# Patient Record
Sex: Female | Born: 1950
Health system: Southern US, Community
[De-identification: ages and names within clinical notes are randomized; demographics above are authoritative.]

## PROBLEM LIST (undated history)

## (undated) DIAGNOSIS — M1990 Unspecified osteoarthritis, unspecified site: Secondary | ICD-10-CM

## (undated) DIAGNOSIS — K219 Gastro-esophageal reflux disease without esophagitis: Secondary | ICD-10-CM

## (undated) DIAGNOSIS — Q614 Renal dysplasia: Secondary | ICD-10-CM

## (undated) DIAGNOSIS — N179 Acute kidney failure, unspecified: Secondary | ICD-10-CM

## (undated) DIAGNOSIS — Z9889 Other specified postprocedural states: Secondary | ICD-10-CM

## (undated) DIAGNOSIS — F329 Major depressive disorder, single episode, unspecified: Secondary | ICD-10-CM

## (undated) DIAGNOSIS — Z973 Presence of spectacles and contact lenses: Secondary | ICD-10-CM

## (undated) DIAGNOSIS — F32A Depression, unspecified: Secondary | ICD-10-CM

## (undated) DIAGNOSIS — T8859XA Other complications of anesthesia, initial encounter: Secondary | ICD-10-CM

## (undated) DIAGNOSIS — N950 Postmenopausal bleeding: Secondary | ICD-10-CM

## (undated) DIAGNOSIS — E039 Hypothyroidism, unspecified: Secondary | ICD-10-CM

## (undated) DIAGNOSIS — N135 Crossing vessel and stricture of ureter without hydronephrosis: Secondary | ICD-10-CM

## (undated) DIAGNOSIS — J449 Chronic obstructive pulmonary disease, unspecified: Secondary | ICD-10-CM

## (undated) DIAGNOSIS — R112 Nausea with vomiting, unspecified: Secondary | ICD-10-CM

## (undated) DIAGNOSIS — R911 Solitary pulmonary nodule: Secondary | ICD-10-CM

## (undated) DIAGNOSIS — I456 Pre-excitation syndrome: Secondary | ICD-10-CM

## (undated) HISTORY — PX: CARDIAC ELECTROPHYSIOLOGY STUDY AND ABLATION: SHX1294

## (undated) HISTORY — PX: DILATION AND CURETTAGE OF UTERUS: SHX78

## (undated) HISTORY — DX: Depression, unspecified: F32.A

## (undated) HISTORY — DX: Major depressive disorder, single episode, unspecified: F32.9

## (undated) HISTORY — DX: Hypothyroidism, unspecified: E03.9

---

## 1998-08-19 HISTORY — PX: EXCISION MORTON'S NEUROMA: SHX5013

## 1998-11-27 ENCOUNTER — Other Ambulatory Visit: Admission: RE | Admit: 1998-11-27 | Discharge: 1998-11-27 | Payer: Self-pay | Admitting: Gynecology

## 1999-02-12 ENCOUNTER — Other Ambulatory Visit: Admission: RE | Admit: 1999-02-12 | Discharge: 1999-02-12 | Payer: Self-pay | Admitting: Podiatry

## 1999-12-27 ENCOUNTER — Encounter: Payer: Self-pay | Admitting: Nephrology

## 1999-12-27 ENCOUNTER — Ambulatory Visit (HOSPITAL_COMMUNITY): Admission: RE | Admit: 1999-12-27 | Discharge: 1999-12-27 | Payer: Self-pay | Admitting: Nephrology

## 2000-04-25 ENCOUNTER — Encounter: Payer: Self-pay | Admitting: Gynecology

## 2000-04-25 ENCOUNTER — Encounter: Admission: RE | Admit: 2000-04-25 | Discharge: 2000-04-25 | Payer: Self-pay | Admitting: Gynecology

## 2000-05-05 ENCOUNTER — Encounter: Admission: RE | Admit: 2000-05-05 | Discharge: 2000-05-05 | Payer: Self-pay | Admitting: Gynecology

## 2000-05-05 ENCOUNTER — Encounter: Payer: Self-pay | Admitting: Gynecology

## 2000-05-19 ENCOUNTER — Other Ambulatory Visit: Admission: RE | Admit: 2000-05-19 | Discharge: 2000-05-19 | Payer: Self-pay | Admitting: Gynecology

## 2000-11-12 ENCOUNTER — Encounter: Admission: RE | Admit: 2000-11-12 | Discharge: 2000-11-12 | Payer: Self-pay | Admitting: Gynecology

## 2000-11-12 ENCOUNTER — Encounter: Payer: Self-pay | Admitting: Gynecology

## 2002-05-28 ENCOUNTER — Other Ambulatory Visit: Admission: RE | Admit: 2002-05-28 | Discharge: 2002-05-28 | Payer: Self-pay | Admitting: Gynecology

## 2003-07-19 ENCOUNTER — Other Ambulatory Visit: Admission: RE | Admit: 2003-07-19 | Discharge: 2003-07-19 | Payer: Self-pay | Admitting: Gynecology

## 2003-07-27 ENCOUNTER — Emergency Department (HOSPITAL_COMMUNITY): Admission: AC | Admit: 2003-07-27 | Discharge: 2003-07-27 | Payer: Self-pay

## 2003-09-06 ENCOUNTER — Encounter: Admission: RE | Admit: 2003-09-06 | Discharge: 2003-09-06 | Payer: Self-pay | Admitting: Gynecology

## 2004-09-25 ENCOUNTER — Other Ambulatory Visit: Admission: RE | Admit: 2004-09-25 | Discharge: 2004-09-25 | Payer: Self-pay | Admitting: Gynecology

## 2004-10-04 ENCOUNTER — Encounter: Admission: RE | Admit: 2004-10-04 | Discharge: 2004-10-04 | Payer: Self-pay | Admitting: Gynecology

## 2005-09-26 ENCOUNTER — Other Ambulatory Visit: Admission: RE | Admit: 2005-09-26 | Discharge: 2005-09-26 | Payer: Self-pay | Admitting: Gynecology

## 2005-10-07 ENCOUNTER — Encounter: Admission: RE | Admit: 2005-10-07 | Discharge: 2005-10-07 | Payer: Self-pay | Admitting: Gynecology

## 2005-10-24 ENCOUNTER — Encounter: Admission: RE | Admit: 2005-10-24 | Discharge: 2005-10-24 | Payer: Self-pay | Admitting: Gynecology

## 2006-09-29 ENCOUNTER — Other Ambulatory Visit: Admission: RE | Admit: 2006-09-29 | Discharge: 2006-09-29 | Payer: Self-pay | Admitting: Gynecology

## 2006-11-27 ENCOUNTER — Encounter: Admission: RE | Admit: 2006-11-27 | Discharge: 2006-11-27 | Payer: Self-pay | Admitting: Gynecology

## 2006-12-02 ENCOUNTER — Encounter: Admission: RE | Admit: 2006-12-02 | Discharge: 2006-12-02 | Payer: Self-pay | Admitting: Gynecology

## 2007-05-26 ENCOUNTER — Encounter: Admission: RE | Admit: 2007-05-26 | Discharge: 2007-05-26 | Payer: Self-pay | Admitting: Gynecology

## 2007-06-24 ENCOUNTER — Ambulatory Visit: Payer: Self-pay | Admitting: Gastroenterology

## 2007-07-03 ENCOUNTER — Ambulatory Visit: Payer: Self-pay | Admitting: Gastroenterology

## 2007-07-03 ENCOUNTER — Encounter: Payer: Self-pay | Admitting: Gastroenterology

## 2007-12-08 ENCOUNTER — Encounter: Admission: RE | Admit: 2007-12-08 | Discharge: 2007-12-08 | Payer: Self-pay | Admitting: Gynecology

## 2008-01-18 ENCOUNTER — Ambulatory Visit (HOSPITAL_COMMUNITY): Admission: RE | Admit: 2008-01-18 | Discharge: 2008-01-18 | Payer: Self-pay | Admitting: Nephrology

## 2008-10-05 ENCOUNTER — Encounter: Payer: Self-pay | Admitting: Cardiology

## 2008-10-05 HISTORY — PX: TRANSTHORACIC ECHOCARDIOGRAM: SHX275

## 2008-11-04 ENCOUNTER — Ambulatory Visit: Payer: Self-pay | Admitting: Internal Medicine

## 2008-11-04 ENCOUNTER — Encounter: Payer: Self-pay | Admitting: Internal Medicine

## 2008-11-11 ENCOUNTER — Ambulatory Visit: Payer: Self-pay

## 2008-11-11 ENCOUNTER — Ambulatory Visit: Payer: Self-pay | Admitting: Internal Medicine

## 2008-11-17 ENCOUNTER — Ambulatory Visit: Payer: Self-pay | Admitting: Internal Medicine

## 2008-11-17 LAB — CONVERTED CEMR LAB
BUN: 15 mg/dL (ref 6–23)
Basophils Absolute: 0.1 10*3/uL (ref 0.0–0.1)
Basophils Relative: 1.2 % (ref 0.0–3.0)
CO2: 28 meq/L (ref 19–32)
Calcium: 9.3 mg/dL (ref 8.4–10.5)
Chloride: 103 meq/L (ref 96–112)
Creatinine, Ser: 1 mg/dL (ref 0.4–1.2)
Eosinophils Absolute: 0.3 10*3/uL (ref 0.0–0.7)
Eosinophils Relative: 2.8 % (ref 0.0–5.0)
GFR calc non Af Amer: 61.07 mL/min (ref >60)
Glucose, Bld: 111 mg/dL — ABNORMAL HIGH (ref 70–99)
HCT: 42.8 % (ref 36.0–46.0)
Hemoglobin: 14.9 g/dL (ref 12.0–15.0)
INR: 1 (ref 0.8–1.0)
Lymphocytes Relative: 31.9 % (ref 12.0–46.0)
Lymphs Abs: 3.2 10*3/uL (ref 0.7–4.0)
MCHC: 34.7 g/dL (ref 30.0–36.0)
MCV: 91.1 fL (ref 78.0–100.0)
Monocytes Absolute: 0.5 10*3/uL (ref 0.1–1.0)
Monocytes Relative: 5.5 % (ref 3.0–12.0)
Neutro Abs: 5.8 10*3/uL (ref 1.4–7.7)
Neutrophils Relative %: 58.6 % (ref 43.0–77.0)
Platelets: 337 10*3/uL (ref 150.0–400.0)
Potassium: 3.7 meq/L (ref 3.5–5.1)
Prothrombin Time: 11 s (ref 10.9–13.3)
RBC: 4.7 M/uL (ref 3.87–5.11)
RDW: 13.4 % (ref 11.5–14.6)
Sodium: 138 meq/L (ref 135–145)
WBC: 9.9 10*3/uL (ref 4.5–10.5)
aPTT: 34.1 s — ABNORMAL HIGH (ref 21.7–28.8)

## 2008-11-23 ENCOUNTER — Observation Stay (HOSPITAL_COMMUNITY): Admission: AD | Admit: 2008-11-23 | Discharge: 2008-11-23 | Payer: Self-pay | Admitting: Internal Medicine

## 2008-11-23 ENCOUNTER — Ambulatory Visit: Payer: Self-pay | Admitting: Internal Medicine

## 2008-12-13 ENCOUNTER — Encounter: Admission: RE | Admit: 2008-12-13 | Discharge: 2008-12-13 | Payer: Self-pay | Admitting: Gynecology

## 2008-12-27 ENCOUNTER — Ambulatory Visit: Payer: Self-pay | Admitting: Internal Medicine

## 2008-12-27 DIAGNOSIS — I456 Pre-excitation syndrome: Secondary | ICD-10-CM | POA: Insufficient documentation

## 2008-12-27 DIAGNOSIS — F17200 Nicotine dependence, unspecified, uncomplicated: Secondary | ICD-10-CM | POA: Insufficient documentation

## 2009-01-06 ENCOUNTER — Encounter: Payer: Self-pay | Admitting: Internal Medicine

## 2009-06-23 ENCOUNTER — Telehealth (INDEPENDENT_AMBULATORY_CARE_PROVIDER_SITE_OTHER): Payer: Self-pay | Admitting: *Deleted

## 2009-06-29 ENCOUNTER — Ambulatory Visit (HOSPITAL_BASED_OUTPATIENT_CLINIC_OR_DEPARTMENT_OTHER): Admission: RE | Admit: 2009-06-29 | Discharge: 2009-06-30 | Payer: Self-pay | Admitting: Gynecology

## 2009-06-29 HISTORY — PX: OTHER SURGICAL HISTORY: SHX169

## 2009-12-28 ENCOUNTER — Encounter: Admission: RE | Admit: 2009-12-28 | Discharge: 2009-12-28 | Payer: Self-pay | Admitting: Gynecology

## 2010-02-21 ENCOUNTER — Encounter: Admission: RE | Admit: 2010-02-21 | Discharge: 2010-02-21 | Payer: Self-pay | Admitting: Nephrology

## 2010-09-09 ENCOUNTER — Encounter: Payer: Self-pay | Admitting: Gynecology

## 2010-11-21 LAB — HEMOGLOBIN AND HEMATOCRIT, BLOOD
HCT: 39.2 % (ref 36.0–46.0)
Hemoglobin: 13.5 g/dL (ref 12.0–15.0)

## 2010-11-21 LAB — POCT HEMOGLOBIN-HEMACUE: Hemoglobin: 16 g/dL — ABNORMAL HIGH (ref 12.0–15.0)

## 2011-01-01 NOTE — Discharge Summary (Signed)
Jennifer George, Jennifer George NO.:  [id number]   MEDICAL RECORD NO.:  [id number]          PATIENT TYPE:  INP   LOCATION:  4705                         FACILITY:  [facility]   PHYSICIAN:  Duke Salvia, MD, FACCDATE OF BIRTH:  1950-09-25   DATE OF ADMISSION:  11/23/2008  DATE OF DISCHARGE:  11/23/2008                               DISCHARGE SUMMARY   This patient has no known drug allergies.   Time for this dictation and explanation of the patient is greater than  35 minutes.   FINAL DIAGNOSES:  1. Discharging day 1, status post electrophysiology study with      radiofrequency catheter ablation of a left posteroseptal accessory      pathway.  This is Wolff-Parkinson-White.  No recurrence of tachy      arrhythmia after ablation.  2. History of tachy palpitation/exercise inducible.      a.     These are self terminating, symptoms are anxiety and       paresthesias.   SECONDARY DIAGNOSES:  1. Treated hypothyroidism.  2. Ongoing tobacco habituation.   PROCEDURE:  November 23, 2008, electrophysiology study with radiofrequency  catheter ablation of a left posteroseptal concealed accessory pathway.  This is the manifest area of pre-excitation, Dr. Sherryl Manges.   BRIEF HISTORY:  Jennifer George is a 60 year old female.  She has a history  dating back 8-10 years of recurrent tachy arrhythmias.  They are  associated with abrupt onset.  They are also associated usually with  exertion.   These arrhythmias can last for an hour or 2.  They are felt by the  patient as an irregular heartbeat.  She does not have lightheadedness,  shortness of breath, or chest pain with the episodes.  Her symptoms are  paresthesias and significant anxiety.  They are frog negative and  diuretic negative.   The patient has worn an event recorder, which showed 2 tachycardias, 1  is an SVT at a rate of 160 beats a minute.  There is another tachycardia  at about 250 beats a minute.  The second faster  tachycardia is  unassociated with any significant symptoms for the patient.   This is an exercise-induced supraventricular tachycardia, probably  mediated by an accessory pathway though it may be an atrial tachycardia.  The patient had an exercise study, which showed induction of a narrow  QRS tachycardia during the exercise.  This was unassociated with PR  prolongation.  During the recovery phase, the patient did have premature  ventricular tachycardias, which she was able to feel as a thumping  feeling.   In light of the monitor studies in light of her symptoms and in light of  her exercise test, therapeutic options have been discussed with the  patient.  They include the use of beta-blockers and calcium channel  blockers, antiarrhythmic drugs as well as catheter ablation.  The risks  and benefits of catheter ablation in particular were discussed with the  patient and she wishes to exercise this modality.  We will try to  schedule this at the first convenient opportunity.   HOSPITAL COURSE:  The  patient presents electively on November 23, 2008, she  underwent electrophysiology study the same day.  A left posteroseptal  concealed accessory pathway was identified consistent with a Jennifer George-  Parkinson-White syndrome.  This was ablated at the manifest pre-  excitation site and there was no inducible SVT after ablation therapy.   The patient will be discharged on enteric-coated aspirin for the next 6  weeks at least.  She will continue her other home medications, which  will include:  1. Synthroid 88 mcg daily.  2. Trazodone 50 mg 2 tablets at bedtime.  3. She also has hormone replacement therapy as she has been practicing      prior to this admission.  4. Flonase as needed.  5. Toprol 50 mg tablets as needed for palpitation.  6. She will be given a prescription for amoxicillin in case she has      dental work in the next 6 months even if it is only teeth cleaning.      She is to take a  250 mg tablet 1 every 8 hours on the day of      dentistry and 1 in the morning the day after dentistry.   She follows up at [facility] [address] to see Dr.  Graciela Husbands on Tuesday, Dec 27, 2008, at 3:30.   LABORATORY STUDIES:  Pertinent to this admission were drawn on November 21, 2008.  Sodium is 138, potassium 3.7, chloride 103, carbonate 28, glucose  111, BUN is 15, and creatinine is 1.  White cells 9.9, hemoglobin 14.9,  hematocrit 42.8, and platelets are 337.  Protime this admission is 11,  INR is 1, and the PTT is 34.1.      Jennifer George, [place]      Duke Salvia, MD, [facility]  Electronically Signed    GM/MEDQ  D:  11/23/2008  T:  11/24/2008  Job:  [id number]   cc:   Colleen Can. Deborah Chalk, M.D.  Gretta Cool, M.D.  Geoffry Paradise, M.D.

## 2011-01-01 NOTE — Op Note (Signed)
Jennifer George, Jennifer George NO.:  [id number]   MEDICAL RECORD NO.:  [id number]          PATIENT TYPE:  INP   LOCATION:  4705                         FACILITY:  [facility]   PHYSICIAN:  Duke Salvia, MD, FACCDATE OF BIRTH:  12-21-1950   DATE OF PROCEDURE:  11/23/2008  DATE OF DISCHARGE:  11/23/2008                               OPERATIVE REPORT   PREOPERATIVE DIAGNOSIS:  Supraventricular tachycardia.   POSTOPERATIVE DIAGNOSIS:  Wolff-Parkinson-White syndrome.   PROCEDURE:  Invasive electrophysiological study, arrhythmia mapping, and  radiofrequency catheter ablation.   Following obtaining informed consent, the patient was brought to the  Electrophysiology Laboratory and placed on the fluoroscopic table in the  supine position.  After routine prep and drape, cardiac catheterization  was performed with local anesthesia and conscious sedation.  Noninvasive  blood pressure monitoring, transcutaneous oxygen saturation monitoring,  and end-tidal CO2 monitoring were performed continuously throughout the  procedure.  Following the procedure, the catheters were removed,  hemostasis was obtained, and the patient was transferred to the floor  stable condition.   Catheters of 5-French quadripolar catheter was inserted via the femoral  vein to the AV junction.   A 5-French quadripolar catheter was inserted via the left femoral vein  to the right ventricular apex.   A 6-French octapolar catheter was inserted via the right femoral vein to  the coronary sinus.   A 7-French 5-mm deflectable tip ablation catheter was inserted via the  right femoral artery to mapping sites in the posterior mitral annular  region.   Surface leads I, aVF, and V1 were monitored continuously throughout the  procedure.  Following insertion of the catheters, a stimulation protocol  included:  1. Incremental atrial pacing.  2. Incremental ventricular pacing.  3. Single atrial extrastimuli paced cycle  length was 604 milliseconds.   END-TIDAL RESULTS:  End-tidal surface electrocardiogram.  Initial:  Rhythm:  Sinus; RR interval 1145 milliseconds; PR interval 169  milliseconds; QRS duration 69 milliseconds; QT interval 394  milliseconds; P-wave duration 89 milliseconds; preexcitation:  Intermittent; bundle-branch block:  Absent.   AH interval was 191 milliseconds; HV interval was either 0 milliseconds  during preexcitation beat or 45 milliseconds and none preexcitation  beat.   Final:  Rhythm:  Sinus; RR interval 940 milliseconds; PR interval 171  milliseconds; QRS duration 120 milliseconds; QT interval 409  milliseconds; P-wave duration 101 milliseconds; AH interval 111  milliseconds; HV interval 44 milliseconds; preexcitation:  Absent.   AV nodal function:  AV Wenckebach was 4 milliseconds.  VA conduction was disassociated.  AV nodal conduction was continuous.   Accessory pathway function:  A left posterior accessory pathway was  identified with an ERP at 400 milliseconds of 300 milliseconds.  It was  intermittently evident.  Upon arriving to the lab, the patient became  increasingly sedated, it was more evident.  She had one-to-one  conduction over the accessory pathway while asleep at paced rate of up  to 400 milliseconds.  Thereafter, we had block.   Attempts to induce arrhythmia were fraught with peril as we ended up  with atrial fibrillation.  Because of that, we  ended up using verapamil  intravenously to augment VA block and then undertook mapping during  ventricular pacing.   I struggled with a fair amount getting the catheter in position  posteriorly.  Once above the mitral annulus, we were able to get it at a  rather septal insertion site.  However, it recurred.  Dr. Ladona Ridgel came by  and offered assistance and he was able to get the catheter rather  expeditiously on the ventricular side of the mitral annulus somewhat  more laterally as is typical with the diagonal  alignment of these  connections and catheter ablation was thereafter successful in  eliminating bidirectional conduction over the accessory pathway.   A total of 5 minutes and 59 seconds of radiofrequency energy was applied  in multiple applications.   A total of 28 minutes and 15 seconds of fluoroscopy time was utilized at  75 frames per second.   IMPRESSION:  1. Normal sinus function.  2. Normal atrial function.  3. Normal His-Purkinje system function.  4. Normal arteriovenous nodal function.  5. A left posterior bidirectional conducting accessory pathway was      identified, more evident as the patient became increasingly sedated      and interestingly with an HV interval of -50 milliseconds at paced      cycle length of 600 milliseconds.  It had not been seen while awake      and this is consistent with very rapid antegrade AV nodal      conduction as must have been true with tachycardia 240 milliseconds      which we had seen following her treadmill.  Catheter ablation      successfully eliminated conduction bidirectionally over this      pathway limiting the patient's substrate for arrhythmia.   SUMMARY:  In conclusion, the results of electrophysiological testing  confirmed a pathway-dependent tachycardia.  We were surprised to find  she had bidirectional conduction over this pathway.  In any case,  catheter ablation successfully eliminated conduction over the pathway  and thereby the substrate for the patient's arrhythmia.   The patient will be observed overnight.  Aspirin daily for 6 weeks.  Endocarditis prophylaxis for 6 weeks.      Duke Salvia, MD, [facility]  Electronically Signed     SCK/MEDQ  D:  11/23/2008  T:  11/24/2008  Job:  [id number]   cc:   Colleen Can. Deborah Chalk, M.D.

## 2011-01-01 NOTE — Letter (Signed)
November 11, 2008    Colleen Can. Deborah Chalk, M.D.  [address]  [place], [place] [address]   RE:  Jennifer George, Jennifer George  MRN:  [id number]  /  DOB:  1950/09/03   Dear Weyman Croon,   This letter is a little bit belated about your patient, Jennifer George.  I know you have had conversations with her husband in the  interim.   As you know, she is a 60 year old woman who smokes who has a  longstanding history of 8-10 years of recurrent abrupt onset tachy  palpitations that are associated with exertion.  They are sufficiently  reproducible that she would say that they occurred every time she would  be in a tennis clinic or any time she did anything besides mild doubles  tennis.  These could last for an hour or two and were frequently  associated with a stark irregularity.  There was no lightheadedness,  shortness of breath or chest pain with these episodes.  There was some  paresthesias and significant anxiety.  They were FROG negative and  diuretic negative.   In the setting of this, you undertook an event recorder which was  strikingly abnormal in that it showed two tachycardias, one was in the  SVT at a rate of about 160 beats per minute, the other was a tachycardia  that I thought for a long time was artifactual that went at a rate of  250 beats per minute and seemed to be somewhat wide.  The time of that  second tachycardia was unassociated with any significant symptoms.   Her past medical history is notable for thyroid disease, arthritis and  discomfort in her legs, possibly claudication occurring with exercise.   She has had no prior surgery.   REVIEW OF SYSTEMS:  Otherwise negative.   MEDICATIONS:  1. Synthroid 88.  2. Trazodone 2 h.s.  3. Hormone replacement therapy.  4. Tylenol.   ALLERGIES:  NO KNOWN DRUG ALLERGIES.   SOCIAL HISTORY:  She is married.  She has three children 27, 24 and 19.  She continues to smoke three-quarters of a pack a day.  She uses some  alcohol and some caffeine.   PHYSICAL EXAMINATION:  VITAL SIGNS:  The other day, her blood pressure  was 120/90 with a pulse of 70, weight was 139.  HEENT:  Demonstrates no icterus or xanthoma.  NECK:  Her neck veins were flat.  The carotids are brisk and full  bilaterally without bruits.  BACK:  Without kyphosis or scoliosis.  LUNGS:  Clear.  HEART:  Sounds were regular without murmurs or gallops.  ABDOMEN:  Soft with active bowel sounds without midline pulsation or  hepatomegaly.  EXTREMITIES:  Femoral pulses were 2+.  Distal pulses were intact.  There  was no clubbing, cyanosis or edema.  NEUROLOGICAL:  Grossly normal.  SKIN:  Warm and dry.   Electrocardiogram dated the other day demonstrated sinus rhythm at 70  with intervals of 0.16/0.8/0.39.  There is no evidence of ventricular  pre-excitation.  There was no isolated PVC.   The exercise test that was done today demonstrated the induction of a  narrow QRS tachycardia during exercise, unassociated with PR  prolongation.  The tachycardia peak cycle length was approximately 240  milliseconds, which was about the cycle length of the tachycardia on the  event recorder that I could not discern the mechanism.   IMPRESSION:  1. Exercise-induced supraventricular tachycardia, probably mediated      via  an accessory pathway, although it may be an atrial tachycardia.  2. Post exertion and resting PVCs associated with a thumping feeling.  3. Poor exercise tolerance as she was only able to go out a minute      into stage II before exhaustion overwhelmed her but just prior to      that she ended having her SVT.  4. Ongoing cigarette use.   DISCUSSION:  Weyman Croon, Mrs. Jennifer George has recurrent exercise-induced  supraventricular tachycardia and postexercise ventricular ectopy.  We  discussed treatment options, including use of beta-blockers, calcium  blockers,  antiarrhythmic drugs with their potential for proarrhythmia  and catheter ablation.   We discussed the potential benefits, as well as  the potential risks, including, but not limited to, death, perforation  and heart block requiring pacemaker implantation.  She would like to  proceed with the latter.   We also discussed the fact that the postexercise ventricular ectopy  would be unmitigated.  She understands and appreciates the fact that if  she could get rid of the exercise-induced tachycardia that the rest of  things may well take care of themselves.   We will plan to schedule this after I get back from spring break on  November 23, 2008.   Thanks very much for this consultation.    Sincerely,      Duke Salvia, MD, [facility]  Electronically Signed    SCK/MedQ  DD: 11/11/2008  DT: 11/11/2008  Job #: [id number]   CC:    Geoffry Paradise, M.D.

## 2011-01-28 ENCOUNTER — Other Ambulatory Visit: Payer: Self-pay | Admitting: Gynecology

## 2011-01-28 DIAGNOSIS — Z1231 Encounter for screening mammogram for malignant neoplasm of breast: Secondary | ICD-10-CM

## 2011-02-01 ENCOUNTER — Ambulatory Visit: Payer: Self-pay

## 2011-02-05 ENCOUNTER — Ambulatory Visit
Admission: RE | Admit: 2011-02-05 | Discharge: 2011-02-05 | Disposition: A | Discharge status: Home / Self Care | Payer: BC Managed Care – PPO | Source: Ambulatory Visit | Attending: Gynecology | Admitting: Gynecology

## 2011-02-05 DIAGNOSIS — Z1231 Encounter for screening mammogram for malignant neoplasm of breast: Secondary | ICD-10-CM

## 2011-02-07 ENCOUNTER — Other Ambulatory Visit: Payer: Self-pay | Admitting: Gynecology

## 2011-04-16 ENCOUNTER — Other Ambulatory Visit: Payer: Self-pay | Admitting: Nephrology

## 2011-04-16 DIAGNOSIS — IMO0002 Reserved for concepts with insufficient information to code with codable children: Secondary | ICD-10-CM

## 2011-04-26 ENCOUNTER — Ambulatory Visit
Admission: RE | Admit: 2011-04-26 | Discharge: 2011-04-26 | Disposition: A | Discharge status: Home / Self Care | Payer: BC Managed Care – PPO | Source: Ambulatory Visit | Attending: Nephrology | Admitting: Nephrology

## 2011-04-26 DIAGNOSIS — IMO0002 Reserved for concepts with insufficient information to code with codable children: Secondary | ICD-10-CM

## 2011-05-07 ENCOUNTER — Other Ambulatory Visit: Payer: Self-pay | Admitting: Nephrology

## 2011-05-07 DIAGNOSIS — N281 Cyst of kidney, acquired: Secondary | ICD-10-CM

## 2011-05-08 ENCOUNTER — Ambulatory Visit
Admission: RE | Admit: 2011-05-08 | Discharge: 2011-05-08 | Disposition: A | Discharge status: Home / Self Care | Payer: BC Managed Care – PPO | Source: Ambulatory Visit | Attending: Nephrology | Admitting: Nephrology

## 2011-05-08 DIAGNOSIS — N281 Cyst of kidney, acquired: Secondary | ICD-10-CM

## 2011-05-09 ENCOUNTER — Other Ambulatory Visit (HOSPITAL_COMMUNITY): Payer: Self-pay | Admitting: Nephrology

## 2011-05-09 DIAGNOSIS — N281 Cyst of kidney, acquired: Secondary | ICD-10-CM

## 2011-05-16 LAB — CBC
HCT: 46.2 — ABNORMAL HIGH
Hemoglobin: 15.7 — ABNORMAL HIGH
MCHC: 34
MCV: 92.1
Platelets: 353
RBC: 5.01
RDW: 14.3
WBC: 11.3 — ABNORMAL HIGH

## 2011-05-16 LAB — PROTIME-INR
INR: 0.9
Prothrombin Time: 12.6

## 2011-05-16 LAB — APTT: aPTT: 34

## 2011-05-29 ENCOUNTER — Ambulatory Visit (HOSPITAL_COMMUNITY)
Admission: RE | Admit: 2011-05-29 | Discharge: 2011-05-29 | Disposition: A | Discharge status: Home / Self Care | Payer: BC Managed Care – PPO | Source: Ambulatory Visit | Attending: Nephrology | Admitting: Nephrology

## 2011-05-29 ENCOUNTER — Other Ambulatory Visit (HOSPITAL_COMMUNITY): Payer: Self-pay | Admitting: Nephrology

## 2011-05-29 DIAGNOSIS — I456 Pre-excitation syndrome: Secondary | ICD-10-CM | POA: Insufficient documentation

## 2011-05-29 DIAGNOSIS — E039 Hypothyroidism, unspecified: Secondary | ICD-10-CM | POA: Insufficient documentation

## 2011-05-29 DIAGNOSIS — Q619 Cystic kidney disease, unspecified: Secondary | ICD-10-CM | POA: Insufficient documentation

## 2011-05-29 DIAGNOSIS — N281 Cyst of kidney, acquired: Secondary | ICD-10-CM

## 2011-05-29 DIAGNOSIS — Z01812 Encounter for preprocedural laboratory examination: Secondary | ICD-10-CM | POA: Insufficient documentation

## 2011-05-29 DIAGNOSIS — F17200 Nicotine dependence, unspecified, uncomplicated: Secondary | ICD-10-CM | POA: Insufficient documentation

## 2011-05-29 LAB — CBC
HCT: 45.6 % (ref 36.0–46.0)
Hemoglobin: 15.8 g/dL — ABNORMAL HIGH (ref 12.0–15.0)
MCH: 30.6 pg (ref 26.0–34.0)
MCHC: 34.6 g/dL (ref 30.0–36.0)
MCV: 88.4 fL (ref 78.0–100.0)
Platelets: 399 10*3/uL (ref 150–400)
RBC: 5.16 MIL/uL — ABNORMAL HIGH (ref 3.87–5.11)
RDW: 14.2 % (ref 11.5–15.5)
WBC: 9.5 10*3/uL (ref 4.0–10.5)

## 2011-05-29 LAB — PROTIME-INR
INR: 0.96 (ref 0.00–1.49)
Prothrombin Time: 13 seconds (ref 11.6–15.2)

## 2011-05-29 LAB — APTT: aPTT: 30 seconds (ref 24–37)

## 2011-12-02 ENCOUNTER — Encounter: Payer: Self-pay | Admitting: *Deleted

## 2012-02-05 ENCOUNTER — Other Ambulatory Visit: Payer: Self-pay | Admitting: Gynecology

## 2012-02-05 DIAGNOSIS — Z1231 Encounter for screening mammogram for malignant neoplasm of breast: Secondary | ICD-10-CM

## 2012-02-17 ENCOUNTER — Other Ambulatory Visit: Payer: Self-pay | Admitting: Gynecology

## 2012-02-19 ENCOUNTER — Ambulatory Visit: Payer: BC Managed Care – PPO

## 2012-06-26 ENCOUNTER — Encounter: Payer: Self-pay | Admitting: Internal Medicine

## 2012-07-14 ENCOUNTER — Ambulatory Visit
Admission: RE | Admit: 2012-07-14 | Discharge: 2012-07-14 | Disposition: A | Discharge status: Home / Self Care | Payer: BC Managed Care – PPO | Source: Ambulatory Visit | Attending: Gynecology | Admitting: Gynecology

## 2012-07-14 DIAGNOSIS — Z1231 Encounter for screening mammogram for malignant neoplasm of breast: Secondary | ICD-10-CM

## 2012-07-17 ENCOUNTER — Other Ambulatory Visit: Payer: Self-pay | Admitting: Gynecology

## 2012-07-17 DIAGNOSIS — R928 Other abnormal and inconclusive findings on diagnostic imaging of breast: Secondary | ICD-10-CM

## 2012-07-27 ENCOUNTER — Ambulatory Visit
Admission: RE | Admit: 2012-07-27 | Discharge: 2012-07-27 | Disposition: A | Discharge status: Home / Self Care | Payer: BC Managed Care – PPO | Source: Ambulatory Visit | Attending: Gynecology | Admitting: Gynecology

## 2012-07-27 DIAGNOSIS — R928 Other abnormal and inconclusive findings on diagnostic imaging of breast: Secondary | ICD-10-CM

## 2013-09-07 ENCOUNTER — Other Ambulatory Visit: Payer: Self-pay

## 2013-09-07 DIAGNOSIS — Z1231 Encounter for screening mammogram for malignant neoplasm of breast: Secondary | ICD-10-CM

## 2013-09-29 ENCOUNTER — Ambulatory Visit
Admission: RE | Admit: 2013-09-29 | Discharge: 2013-09-29 | Disposition: A | Discharge status: Home / Self Care | Payer: BC Managed Care – PPO | Source: Ambulatory Visit

## 2013-09-29 DIAGNOSIS — Z1231 Encounter for screening mammogram for malignant neoplasm of breast: Secondary | ICD-10-CM

## 2014-11-01 ENCOUNTER — Other Ambulatory Visit: Payer: Self-pay | Admitting: Internal Medicine

## 2014-11-01 DIAGNOSIS — Z139 Encounter for screening, unspecified: Secondary | ICD-10-CM

## 2014-11-07 ENCOUNTER — Other Ambulatory Visit: Payer: Self-pay

## 2014-11-07 DIAGNOSIS — Z1231 Encounter for screening mammogram for malignant neoplasm of breast: Secondary | ICD-10-CM

## 2014-11-17 ENCOUNTER — Ambulatory Visit
Admission: RE | Admit: 2014-11-17 | Discharge: 2014-11-17 | Disposition: A | Discharge status: Home / Self Care | Payer: No Typology Code available for payment source | Source: Ambulatory Visit | Attending: Internal Medicine | Admitting: Internal Medicine

## 2014-11-17 DIAGNOSIS — Z139 Encounter for screening, unspecified: Secondary | ICD-10-CM

## 2014-11-18 ENCOUNTER — Ambulatory Visit: Payer: Self-pay

## 2015-01-26 ENCOUNTER — Ambulatory Visit
Admission: RE | Admit: 2015-01-26 | Discharge: 2015-01-26 | Disposition: A | Discharge status: Home / Self Care | Payer: BLUE CROSS/BLUE SHIELD | Source: Ambulatory Visit

## 2015-01-26 DIAGNOSIS — Z1231 Encounter for screening mammogram for malignant neoplasm of breast: Secondary | ICD-10-CM

## 2015-07-19 ENCOUNTER — Other Ambulatory Visit: Payer: Self-pay | Admitting: Internal Medicine

## 2015-07-19 ENCOUNTER — Ambulatory Visit
Admission: RE | Admit: 2015-07-19 | Discharge: 2015-07-19 | Disposition: A | Discharge status: Home / Self Care | Payer: BLUE CROSS/BLUE SHIELD | Source: Ambulatory Visit | Attending: Internal Medicine | Admitting: Internal Medicine

## 2015-07-19 DIAGNOSIS — R1031 Right lower quadrant pain: Secondary | ICD-10-CM

## 2015-07-20 ENCOUNTER — Observation Stay (HOSPITAL_COMMUNITY)
Admission: EM | Admit: 2015-07-20 | Discharge: 2015-07-21 | Disposition: A | Discharge status: Home / Self Care | Payer: BLUE CROSS/BLUE SHIELD | Source: Home / Self Care | Attending: Internal Medicine | Admitting: Internal Medicine

## 2015-07-20 ENCOUNTER — Inpatient Hospital Stay: Admission: RE | Admit: 2015-07-20 | Payer: BLUE CROSS/BLUE SHIELD | Source: Ambulatory Visit

## 2015-07-20 ENCOUNTER — Encounter (HOSPITAL_COMMUNITY): Payer: Self-pay | Admitting: Emergency Medicine

## 2015-07-20 DIAGNOSIS — E039 Hypothyroidism, unspecified: Secondary | ICD-10-CM | POA: Insufficient documentation

## 2015-07-20 DIAGNOSIS — Q6100 Congenital renal cyst, unspecified: Secondary | ICD-10-CM

## 2015-07-20 DIAGNOSIS — F17210 Nicotine dependence, cigarettes, uncomplicated: Secondary | ICD-10-CM

## 2015-07-20 DIAGNOSIS — Z7982 Long term (current) use of aspirin: Secondary | ICD-10-CM

## 2015-07-20 DIAGNOSIS — Z720 Tobacco use: Secondary | ICD-10-CM | POA: Diagnosis not present

## 2015-07-20 DIAGNOSIS — N281 Cyst of kidney, acquired: Secondary | ICD-10-CM

## 2015-07-20 DIAGNOSIS — Q619 Cystic kidney disease, unspecified: Secondary | ICD-10-CM | POA: Diagnosis not present

## 2015-07-20 DIAGNOSIS — N1330 Unspecified hydronephrosis: Secondary | ICD-10-CM | POA: Insufficient documentation

## 2015-07-20 DIAGNOSIS — N179 Acute kidney failure, unspecified: Secondary | ICD-10-CM | POA: Diagnosis present

## 2015-07-20 DIAGNOSIS — Z79899 Other long term (current) drug therapy: Secondary | ICD-10-CM | POA: Insufficient documentation

## 2015-07-20 DIAGNOSIS — R911 Solitary pulmonary nodule: Secondary | ICD-10-CM | POA: Insufficient documentation

## 2015-07-20 DIAGNOSIS — Q613 Polycystic kidney, unspecified: Secondary | ICD-10-CM

## 2015-07-20 DIAGNOSIS — Z791 Long term (current) use of non-steroidal anti-inflammatories (NSAID): Secondary | ICD-10-CM

## 2015-07-20 LAB — CBC WITH DIFFERENTIAL/PLATELET
Basophils Absolute: 0.1 10*3/uL (ref 0.0–0.1)
Basophils Relative: 1 %
Eosinophils Absolute: 0.2 10*3/uL (ref 0.0–0.7)
Eosinophils Relative: 2 %
HCT: 38.5 % (ref 36.0–46.0)
Hemoglobin: 13.3 g/dL (ref 12.0–15.0)
Lymphocytes Relative: 19 %
Lymphs Abs: 2.3 10*3/uL (ref 0.7–4.0)
MCH: 30.4 pg (ref 26.0–34.0)
MCHC: 34.5 g/dL (ref 30.0–36.0)
MCV: 87.9 fL (ref 78.0–100.0)
Monocytes Absolute: 0.8 10*3/uL (ref 0.1–1.0)
Monocytes Relative: 7 %
Neutro Abs: 8.4 10*3/uL — ABNORMAL HIGH (ref 1.7–7.7)
Neutrophils Relative %: 71 %
Platelets: 414 10*3/uL — ABNORMAL HIGH (ref 150–400)
RBC: 4.38 MIL/uL (ref 3.87–5.11)
RDW: 14.2 % (ref 11.5–15.5)
WBC: 11.7 10*3/uL — ABNORMAL HIGH (ref 4.0–10.5)

## 2015-07-20 LAB — URINALYSIS, ROUTINE W REFLEX MICROSCOPIC
Bilirubin Urine: NEGATIVE
Glucose, UA: NEGATIVE mg/dL
Hgb urine dipstick: NEGATIVE
Ketones, ur: NEGATIVE mg/dL
Leukocytes, UA: NEGATIVE
Nitrite: NEGATIVE
Protein, ur: NEGATIVE mg/dL
Specific Gravity, Urine: 1.012 (ref 1.005–1.030)
pH: 6 (ref 5.0–8.0)

## 2015-07-20 LAB — HEPATIC FUNCTION PANEL
ALT: 25 U/L (ref 14–54)
AST: 21 U/L (ref 15–41)
Albumin: 3.2 g/dL — ABNORMAL LOW (ref 3.5–5.0)
Alkaline Phosphatase: 85 U/L (ref 38–126)
Bilirubin, Direct: 0.1 mg/dL (ref 0.1–0.5)
Indirect Bilirubin: 0.4 mg/dL (ref 0.3–0.9)
Total Bilirubin: 0.5 mg/dL (ref 0.3–1.2)
Total Protein: 6.9 g/dL (ref 6.5–8.1)

## 2015-07-20 LAB — BASIC METABOLIC PANEL
Anion gap: 7 (ref 5–15)
BUN: 26 mg/dL — ABNORMAL HIGH (ref 6–20)
CO2: 25 mmol/L (ref 22–32)
Calcium: 9.6 mg/dL (ref 8.9–10.3)
Chloride: 105 mmol/L (ref 101–111)
Creatinine, Ser: 2.38 mg/dL — ABNORMAL HIGH (ref 0.44–1.00)
GFR calc Af Amer: 24 mL/min — ABNORMAL LOW (ref >60)
GFR calc non Af Amer: 21 mL/min — ABNORMAL LOW (ref >60)
Glucose, Bld: 124 mg/dL — ABNORMAL HIGH (ref 65–99)
Potassium: 4.1 mmol/L (ref 3.5–5.1)
Sodium: 137 mmol/L (ref 135–145)

## 2015-07-20 MED ORDER — SODIUM CHLORIDE 0.9 % IV SOLN
INTRAVENOUS | Status: DC
  Administered 2015-07-20 (×2): via INTRAVENOUS

## 2015-07-20 MED ORDER — NICOTINE 21 MG/24HR TD PT24
21.0000 mg | MEDICATED_PATCH | Freq: Every day | TRANSDERMAL | Status: DC
  Filled 2015-07-20: qty 1

## 2015-07-20 MED ORDER — TRAZODONE HCL 100 MG PO TABS
100.0000 mg | ORAL_TABLET | Freq: Every day | ORAL | Status: DC
  Administered 2015-07-20: 100 mg via ORAL
  Filled 2015-07-20: qty 1

## 2015-07-20 MED ORDER — TRAZODONE HCL 100 MG PO TABS
200.0000 mg | ORAL_TABLET | Freq: Every day | ORAL | Status: DC
  Filled 2015-07-20: qty 2

## 2015-07-20 MED ORDER — ONDANSETRON HCL 4 MG/2ML IJ SOLN: 4.0000 mg | Freq: Once | INTRAMUSCULAR | Status: DC

## 2015-07-20 MED ORDER — HYDROMORPHONE HCL 1 MG/ML IJ SOLN: 1.0000 mg | Freq: Once | INTRAMUSCULAR | Status: DC

## 2015-07-20 MED ORDER — TRAZODONE HCL 100 MG PO TABS
100.0000 mg | ORAL_TABLET | Freq: Once | ORAL | Status: AC
  Administered 2015-07-20: 100 mg via ORAL
  Filled 2015-07-20: qty 1

## 2015-07-20 MED ORDER — VITAMIN B-6 100 MG PO TABS
100.0000 mg | ORAL_TABLET | Freq: Every day | ORAL | Status: DC
  Filled 2015-07-20: qty 1

## 2015-07-20 MED ORDER — ACETAMINOPHEN 325 MG PO TABS: 650.0000 mg | ORAL_TABLET | Freq: Four times a day (QID) | ORAL | Status: DC | PRN

## 2015-07-20 MED ORDER — SODIUM CHLORIDE 0.9 % IV SOLN
INTRAVENOUS | Status: DC
  Administered 2015-07-20: 16:00:00 via INTRAVENOUS

## 2015-07-20 MED ORDER — LEVOTHYROXINE SODIUM 88 MCG PO TABS
88.0000 ug | ORAL_TABLET | Freq: Every day | ORAL | Status: DC
  Filled 2015-07-20 (×2): qty 1

## 2015-07-20 MED ORDER — TRAZODONE HCL 50 MG PO TABS: 5000.0000 mg | ORAL_TABLET | Freq: Every day | ORAL | Status: DC

## 2015-07-20 MED ORDER — VITAMIN D3 25 MCG (1000 UNIT) PO TABS
1000.0000 [IU] | ORAL_TABLET | Freq: Every day | ORAL | Status: DC
Start: 2015-07-21 — End: 2015-07-21
  Filled 2015-07-20: qty 1

## 2015-07-20 MED ORDER — RISAQUAD PO CAPS
1.0000 | ORAL_CAPSULE | Freq: Every day | ORAL | Status: DC
  Filled 2015-07-20: qty 1

## 2015-07-20 MED ORDER — ATORVASTATIN CALCIUM 20 MG PO TABS
20.0000 mg | ORAL_TABLET | Freq: Every day | ORAL | Status: DC
  Filled 2015-07-20: qty 1

## 2015-07-20 MED ORDER — SODIUM CHLORIDE 0.9 % IV SOLN: INTRAVENOUS | Status: DC

## 2015-07-20 NOTE — ED Notes (Signed)
Pt reports RLQ pain from polycystic kidney disease (has had multiple cysts drained). Says this is an ongoing issue for her and she would like to have as many cysts drained as possible. Had CT scan done yesterday at [facility] and was referred here by MD Dwaine Gale. No other s/s. Denies N/V/D/dysuria/urinary retention. [facility] is able to fax CT results.

## 2015-07-20 NOTE — ED Notes (Signed)
Pt can go up at 19:10

## 2015-07-20 NOTE — H&P (Signed)
History and Physical  Jennifer George [id number] DOB: [birth year] DOA: 07/20/2015  Referring physician: EDP PCP: Geoffery Lyons, MD   Chief Complaint: worsening of ab pain  HPI: Jennifer George is a 64 y.o. female   With history of cystic kidney disease, has multiple IR drain of the cyst, last in 2012 started to have right lower abdominal pain a few days ago, Ct ab/pel done by pmd showed eright hydronephrosis and peinephric stranding, enlargement of right kidney cyst may be compressing the right ureter, she was sent to the ED for further eval, initial labs showed she has ARF with cr of 2.38. UA unremarkable.  She reported decreased oral intake for the last few days , she has been on diclofenac for about a year, was on bid for 35months, then changed to daily, she denies fever, no n/v, no cough. She smokes cigarette daily. EDP consulted urology, urology recommended IR drain of the renal cyst. hospitalist called to admit the patient.  Review of Systems:  Detail per HPI, Review of systems are otherwise negative  Past Medical History  Diagnosis Date  . Morton's neuroma 2000    removed  . Hypothyroidism   . Depression    Past Surgical History  Procedure Laterality Date  . Dilation and curettage of uterus  1981 & 1982   Social History:  reports that she has been smoking.  She has never used smokeless tobacco. She reports that she drinks alcohol. Her drug history is not on file. Patient lives at home & is able to participate in activities of daily living independently   No Known Allergies  History reviewed. No pertinent family history.    Prior to Admission medications   Medication Sig Start Date End Date Taking? Authorizing Provider  aspirin 81 MG tablet Take 81 mg by mouth daily.   Yes Historical Provider, MD  atorvastatin (LIPITOR) 20 MG tablet Take 20 mg by mouth daily. 06/17/15  Yes Historical Provider, MD  bifidobacterium infantis (ALIGN) capsule Take 1 capsule by  mouth daily.   Yes Historical Provider, MD  cholecalciferol (VITAMIN D) 1000 UNITS tablet Take 1,000 Units by mouth daily.   Yes Historical Provider, MD  diclofenac (VOLTAREN) 50 MG EC tablet Take 50 mg by mouth at bedtime.   Yes Historical Provider, MD  estradiol (VIVELLE-DOT) 0.05 MG/24HR patch Place 1 patch onto the skin 2 (two) times a week. Tues and Saturday.   Yes Historical Provider, MD  ibuprofen (ADVIL,MOTRIN) 200 MG tablet Take 800 mg by mouth daily as needed for moderate pain.   Yes Historical Provider, MD  levothyroxine (SYNTHROID, LEVOTHROID) 88 MCG tablet Take 88 mcg by mouth daily.   Yes Historical Provider, MD  pyridOXINE (VITAMIN B-6) 100 MG tablet Take 100 mg by mouth daily.   Yes Historical Provider, MD  traZODone (DESYREL) 50 MG tablet Take 100 tablets by mouth at bedtime.    Yes Historical Provider, MD  progesterone (PROMETRIUM) 200 MG capsule TAKE 1 CAPSULE(S) EVERY DAY BY ORAL ROUTE FOR 14 NIGHTS EVERY OTHER MONTH 06/20/15   Historical Provider, MD    Physical Exam: BP 133/69 mmHg  Pulse 86  Temp(Src) 98.1 F (36.7 C) (Oral)  Resp 20  SpO2 98%  General:  NAD, anxious Eyes: PERRL ENT: unremarkable Neck: supple, no JVD Cardiovascular: RRR Respiratory: CTABL Abdomen: fullness right lower quadrant, tender with guarding, no rebound, soft, positive bowel sounds Skin: no rash Musculoskeletal:  No edema Psychiatric: calm/cooperative Neurologic: no focal findings  Labs on Admission:  Basic Metabolic Panel:  Recent Labs Lab 07/20/15 1607  NA 137  K 4.1  CL 105  CO2 25  GLUCOSE 124*  BUN 26*  CREATININE 2.38*  CALCIUM 9.6   Liver Function Tests: No results for input(s): AST, ALT, ALKPHOS, BILITOT, PROT, ALBUMIN in the last 168 hours. No results for input(s): LIPASE, AMYLASE in the last 168 hours. No results for input(s): AMMONIA in the last 168 hours. CBC:  Recent Labs Lab 07/20/15 1607  WBC 11.7*  NEUTROABS 8.4*  HGB 13.3  HCT 38.5    MCV 87.9  PLT 414*   Cardiac Enzymes: No results for input(s): CKTOTAL, CKMB, CKMBINDEX, TROPONINI in the last 168 hours.  BNP (last 3 results) No results for input(s): BNP in the last 8760 hours.  ProBNP (last 3 results) No results for input(s): PROBNP in the last 8760 hours.  CBG: No results for input(s): GLUCAP in the last 168 hours.  Radiological Exams on Admission: Ct Abdomen Pelvis Wo Contrast  07/20/2015  CLINICAL DATA:  Right lower quadrant pain EXAM: CT ABDOMEN AND PELVIS WITHOUT CONTRAST TECHNIQUE: Multidetector CT imaging of the abdomen and pelvis was performed following the standard protocol without IV contrast. COMPARISON:  None. FINDINGS: Innumerable cysts are seen in both kidneys. There are some rim calcifications associated with left renal cysts. The largest right renal cyst is 13 cm. Previously this was reported at 11.5 cm, therefore it has enlarged. Moderate right hydronephrosis is present. There is right-sided perinephric fluid density. No obvious ureteral calculus can be identified. There is an apparent rind of soft tissue surrounding the aorta. There is no soft tissue posterior to the aorta. This does extend to the iliac vasculature region. Appendix is within normal limits. 5 mm sub solid left lower lobe pulmonary nodule posteriorly is partially imaged on image 1. Liver, gallbladder, spleen, pancreas, adrenal glands are within normal limits. Diverticulosis throughout much of the colon. Sigmoid is decompressed Bladder, uterus, and adnexa are unremarkable. No vertebral compression deformity. IMPRESSION: Right hydronephrosis and perinephric stranding have developed. These are secondary findings of ureteral obstruction. No ureteral calculus can be seen. There is an on enlarging right renal cyst which may be compressing the right ureter. There is also a soft tissue rind surrounding the aorta which may represent retroperitoneal fibrosis or possibly lymphoma. CT abdomen and pelvis  with contrast is recommended to further characterize, and is recommended as the first next step. The soft tissue rind will be better evaluated by this. Drainage of the right renal cyst may improve drainage of the right kidney. Retrograde pyelogram can be performed to evaluate for obstructing lesion in the right ureter. 5 mm sub solid nodule in the left lower lobe. Initial follow-up by chest CT without contrast is recommended in 3 months to confirm persistence. This recommendation follows the consensus statement: Recommendations for the Management of Subsolid Pulmonary Nodules Detected at CT: A Statement from the [place] as published in Radiology 2013; 266:304-317. Electronically Signed   By: Marybelle Killings M.D.   On: 07/20/2015 08:34      Assessment/Plan Present on Admission:  . Acute kidney injury ([place])  smyptomatic renal cyst/h/o cystic kidney disease: prn pain control, hold asa, IR consulted for drain of the cyst in am. Npo after midnight.  ARF: baseline cr wnl, cr 2.38 on admission, ua unremarkable, d/c NSAIDs, start ivf, repeat bmp in am  Hypothyroidism: check tsh. Continue synthroid.  Smoker: smoking cessation education provided, patient reported not ready to quit,  agreed to nicotine patch while in the hospital.  Retroperitoneal fibrosis? /2mm solid left lower lobe lung nodule, repeat ct with contrast was recommended, will need to normalize cr first.  DVT prophylaxis: scd's  Consultants:  Interventional radiology Urology contacted by EDP  Code Status: full   Family Communication:  Patient and husband  Disposition Plan: admit to med surg  Time spent: 40mins  Yarieliz Wasser MD, PhD [facility] Pager [phone] If 7PM-7AM, please contact night-coverage at [email or web address], password [facility]

## 2015-07-20 NOTE — ED Notes (Signed)
Bed: [id number] Expected date:  Expected time:  Means of arrival:  Comments: PCP sending pt over-obstructing cyst on kidney

## 2015-07-20 NOTE — ED Provider Notes (Signed)
CSN: [id number]     Arrival date & time 07/20/15  1456 History   First MD Initiated Contact with Patient 07/20/15 1530     Chief Complaint  Patient presents with  . Cyst    kidneys  . Polycystic Kidney Disease      (Consider location/radiation/quality/duration/timing/severity/associated sxs/prior Treatment) HPI Comments: Pt here with worsening abd pain x 2 days from her known polycystic kidney dz- Pt had an outpatient abd ct that showed a larger kidney cyst--pt has had these drained before and called her pcp and told to come her She denies vomiting, fever, severe diarrhea Pain characterized as sharp and worse with movement Using motrin and tylenol without relief  The history is provided by the patient.    Past Medical History  Diagnosis Date  . Morton's neuroma 2000    removed  . Hypothyroidism   . Depression    Past Surgical History  Procedure Laterality Date  . Dilation and curettage of uterus  1981 & 1982   History reviewed. No pertinent family history. Social History  Substance Use Topics  . Smoking status: Current Every Day Smoker -- 0.80 packs/day  . Smokeless tobacco: Never Used  . Alcohol Use: Yes     Comment: socially   OB History    No data available     Review of Systems  All other systems reviewed and are negative.     Allergies  Review of patient's allergies indicates no known allergies.  Home Medications   Prior to Admission medications   Medication Sig Start Date End Date Taking? Authorizing Provider  aspirin 81 MG tablet Take 81 mg by mouth daily.   Yes Historical Provider, MD  atorvastatin (LIPITOR) 20 MG tablet Take 20 mg by mouth daily. 06/17/15  Yes Historical Provider, MD  bifidobacterium infantis (ALIGN) capsule Take 1 capsule by mouth daily.   Yes Historical Provider, MD  cholecalciferol (VITAMIN D) 1000 UNITS tablet Take 1,000 Units by mouth daily.   Yes Historical Provider, MD  diclofenac (VOLTAREN) 50 MG EC tablet Take 50 mg by  mouth at bedtime.   Yes Historical Provider, MD  estradiol (VIVELLE-DOT) 0.05 MG/24HR patch Place 1 patch onto the skin 2 (two) times a week. Tues and Saturday.   Yes Historical Provider, MD  ibuprofen (ADVIL,MOTRIN) 200 MG tablet Take 800 mg by mouth daily as needed for moderate pain.   Yes Historical Provider, MD  levothyroxine (SYNTHROID, LEVOTHROID) 88 MCG tablet Take 88 mcg by mouth daily.   Yes Historical Provider, MD  pyridOXINE (VITAMIN B-6) 100 MG tablet Take 100 mg by mouth daily.   Yes Historical Provider, MD  traZODone (DESYREL) 50 MG tablet Take 100 tablets by mouth at bedtime.    Yes Historical Provider, MD  progesterone (PROMETRIUM) 200 MG capsule TAKE 1 CAPSULE(S) EVERY DAY BY ORAL ROUTE FOR 14 NIGHTS EVERY OTHER MONTH 06/20/15   Historical Provider, MD   BP 133/69 mmHg  Pulse 86  Temp(Src) 98.1 F (36.7 C) (Oral)  Resp 20  SpO2 98% Physical Exam  Constitutional: She is oriented to person, place, and time. She appears well-developed and well-nourished.  Non-toxic appearance. No distress.  HENT:  Head: Normocephalic and atraumatic.  Eyes: Conjunctivae, EOM and lids are normal. Pupils are equal, round, and reactive to light.  Neck: Normal range of motion. Neck supple. No tracheal deviation present. No thyroid mass present.  Cardiovascular: Normal rate, regular rhythm and normal heart sounds.  Exam reveals no gallop.   No murmur heard.  Pulmonary/Chest: Effort normal and breath sounds normal. No stridor. No respiratory distress. She has no decreased breath sounds. She has no wheezes. She has no rhonchi. She has no rales.  Abdominal: Soft. Normal appearance and bowel sounds are normal. She exhibits no distension. There is no tenderness. There is no rebound and no CVA tenderness.    Musculoskeletal: Normal range of motion. She exhibits no edema or tenderness.  Neurological: She is alert and oriented to person, place, and time. She has normal strength. No cranial nerve deficit or  sensory deficit. GCS eye subscore is 4. GCS verbal subscore is 5. GCS motor subscore is 6.  Skin: Skin is warm and dry. No abrasion and no rash noted.  Psychiatric: She has a normal mood and affect. Her speech is normal and behavior is normal.  Nursing note and vitals reviewed.   ED Course  Procedures (including critical care time) Labs Review Labs Reviewed  CBC WITH DIFFERENTIAL/PLATELET  BASIC METABOLIC PANEL  URINALYSIS, ROUTINE W REFLEX MICROSCOPIC (NOT AT [facility])    Imaging Review Ct Abdomen Pelvis Wo Contrast  07/20/2015  CLINICAL DATA:  Right lower quadrant pain EXAM: CT ABDOMEN AND PELVIS WITHOUT CONTRAST TECHNIQUE: Multidetector CT imaging of the abdomen and pelvis was performed following the standard protocol without IV contrast. COMPARISON:  None. FINDINGS: Innumerable cysts are seen in both kidneys. There are some rim calcifications associated with left renal cysts. The largest right renal cyst is 13 cm. Previously this was reported at 11.5 cm, therefore it has enlarged. Moderate right hydronephrosis is present. There is right-sided perinephric fluid density. No obvious ureteral calculus can be identified. There is an apparent rind of soft tissue surrounding the aorta. There is no soft tissue posterior to the aorta. This does extend to the iliac vasculature region. Appendix is within normal limits. 5 mm sub solid left lower lobe pulmonary nodule posteriorly is partially imaged on image 1. Liver, gallbladder, spleen, pancreas, adrenal glands are within normal limits. Diverticulosis throughout much of the colon. Sigmoid is decompressed Bladder, uterus, and adnexa are unremarkable. No vertebral compression deformity. IMPRESSION: Right hydronephrosis and perinephric stranding have developed. These are secondary findings of ureteral obstruction. No ureteral calculus can be seen. There is an on enlarging right renal cyst which may be compressing the right ureter. There is also a soft tissue rind  surrounding the aorta which may represent retroperitoneal fibrosis or possibly lymphoma. CT abdomen and pelvis with contrast is recommended to further characterize, and is recommended as the first next step. The soft tissue rind will be better evaluated by this. Drainage of the right renal cyst may improve drainage of the right kidney. Retrograde pyelogram can be performed to evaluate for obstructing lesion in the right ureter. 5 mm sub solid nodule in the left lower lobe. Initial follow-up by chest CT without contrast is recommended in 3 months to confirm persistence. This recommendation follows the consensus statement: Recommendations for the Management of Subsolid Pulmonary Nodules Detected at CT: A Statement from the [employer] as published in Radiology 2013; 266:304-317. Electronically Signed   By: Marybelle Killings M.D.   On: 07/20/2015 08:34   I have personally reviewed and evaluated these images and lab results as part of my medical decision-making.   EKG Interpretation None      MDM   Final diagnoses:  None    Patient with elevated creatinine likely due to obstruction from her large kidney cyst. Discussed the case with urology on call who recommends that patient have  the cyst drained by interventional radiology. Due to patient's evidence of kidney injury. Will be admitted to the hospital to have a procedure done tomorrow    Lacretia Leigh, MD 07/20/15 1731

## 2015-07-20 NOTE — ED Notes (Signed)
Unable to collect labs MD talking to patient.

## 2015-07-20 NOTE — Progress Notes (Signed)
pcp  Is Jennifer George updated

## 2015-07-21 ENCOUNTER — Observation Stay (HOSPITAL_COMMUNITY): Payer: BLUE CROSS/BLUE SHIELD

## 2015-07-21 DIAGNOSIS — Q6100 Congenital renal cyst, unspecified: Secondary | ICD-10-CM | POA: Diagnosis not present

## 2015-07-21 DIAGNOSIS — N179 Acute kidney failure, unspecified: Secondary | ICD-10-CM | POA: Diagnosis not present

## 2015-07-21 LAB — PROTIME-INR
INR: 1.08 (ref 0.00–1.49)
Prothrombin Time: 14.2 seconds (ref 11.6–15.2)

## 2015-07-21 LAB — GRAM STAIN

## 2015-07-21 LAB — CBC
HCT: 37.9 % (ref 36.0–46.0)
Hemoglobin: 12.8 g/dL (ref 12.0–15.0)
MCH: 29.8 pg (ref 26.0–34.0)
MCHC: 33.8 g/dL (ref 30.0–36.0)
MCV: 88.3 fL (ref 78.0–100.0)
Platelets: 415 10*3/uL — ABNORMAL HIGH (ref 150–400)
RBC: 4.29 MIL/uL (ref 3.87–5.11)
RDW: 14.2 % (ref 11.5–15.5)
WBC: 11 10*3/uL — ABNORMAL HIGH (ref 4.0–10.5)

## 2015-07-21 LAB — COMPREHENSIVE METABOLIC PANEL
ALT: 28 U/L (ref 14–54)
AST: 17 U/L (ref 15–41)
Albumin: 2.9 g/dL — ABNORMAL LOW (ref 3.5–5.0)
Alkaline Phosphatase: 80 U/L (ref 38–126)
Anion gap: 6 (ref 5–15)
BUN: 32 mg/dL — ABNORMAL HIGH (ref 6–20)
CO2: 23 mmol/L (ref 22–32)
Calcium: 9.5 mg/dL (ref 8.9–10.3)
Chloride: 111 mmol/L (ref 101–111)
Creatinine, Ser: 2.27 mg/dL — ABNORMAL HIGH (ref 0.44–1.00)
GFR calc Af Amer: 25 mL/min — ABNORMAL LOW (ref >60)
GFR calc non Af Amer: 22 mL/min — ABNORMAL LOW (ref >60)
Glucose, Bld: 102 mg/dL — ABNORMAL HIGH (ref 65–99)
Potassium: 4.5 mmol/L (ref 3.5–5.1)
Sodium: 140 mmol/L (ref 135–145)
Total Bilirubin: 0.5 mg/dL (ref 0.3–1.2)
Total Protein: 6.4 g/dL — ABNORMAL LOW (ref 6.5–8.1)

## 2015-07-21 LAB — C-REACTIVE PROTEIN: CRP: 7.7 mg/dL — ABNORMAL HIGH (ref <1.0)

## 2015-07-21 LAB — TSH: TSH: 0.217 u[IU]/mL — ABNORMAL LOW (ref 0.350–4.500)

## 2015-07-21 LAB — SEDIMENTATION RATE: Sed Rate: 51 mm/hr — ABNORMAL HIGH (ref 0–22)

## 2015-07-21 MED ORDER — FENTANYL CITRATE (PF) 100 MCG/2ML IJ SOLN
INTRAMUSCULAR | Status: AC | PRN
  Administered 2015-07-21 (×2): 50 ug via INTRAVENOUS

## 2015-07-21 MED ORDER — FENTANYL CITRATE (PF) 100 MCG/2ML IJ SOLN
INTRAMUSCULAR | Status: AC
  Filled 2015-07-21: qty 2

## 2015-07-21 MED ORDER — NALOXONE HCL 0.4 MG/ML IJ SOLN
INTRAMUSCULAR | Status: AC
  Filled 2015-07-21: qty 1

## 2015-07-21 MED ORDER — MIDAZOLAM HCL 2 MG/2ML IJ SOLN
INTRAMUSCULAR | Status: AC
  Filled 2015-07-21: qty 4

## 2015-07-21 MED ORDER — FLUMAZENIL 0.5 MG/5ML IV SOLN
INTRAVENOUS | Status: AC
  Filled 2015-07-21: qty 5

## 2015-07-21 MED ORDER — MIDAZOLAM HCL 2 MG/2ML IJ SOLN
INTRAMUSCULAR | Status: AC | PRN
  Administered 2015-07-21 (×3): 1 mg via INTRAVENOUS

## 2015-07-21 NOTE — Progress Notes (Signed)
1300 bandaid to right abdomen dry and intact. Bethann Punches RN

## 2015-07-21 NOTE — Procedures (Signed)
Right renal cyst aspiration 900 cc No comp/EBL

## 2015-07-21 NOTE — Progress Notes (Signed)
1230 received from IR alert, VSS band aid to right abdomen dry and intact D  [employer]

## 2015-07-21 NOTE — Progress Notes (Signed)
Chief Complaint: Patient was seen in consultation today for symptomatic right renal cyst at the request of Dr. Florencia Reasons  Referring Physician(s): Dr. Florencia Reasons  History of Present Illness: Jennifer George is a 64 y.o. female with polycystic kidney disease. She has had prior aspiration of renal cysts. She now has large symptomatic right renal cyst. IR is requested to aspirate for therapeutic relief. PMHx, meds, imaging, labs, allergies reviewed. Has been NPO this am  Past Medical History  Diagnosis Date  . Morton's neuroma 2000    removed  . Hypothyroidism   . Depression     Past Surgical History  Procedure Laterality Date  . Dilation and curettage of uterus  1981 & 1982    Allergies: Review of patient's allergies indicates no known allergies.  Medications: Prior to Admission medications   Medication Sig Start Date End Date Taking? Authorizing Provider  aspirin 81 MG tablet Take 81 mg by mouth daily.   Yes Historical Provider, MD  atorvastatin (LIPITOR) 20 MG tablet Take 20 mg by mouth daily. 06/17/15  Yes Historical Provider, MD  bifidobacterium infantis (ALIGN) capsule Take 1 capsule by mouth daily.   Yes Historical Provider, MD  cholecalciferol (VITAMIN D) 1000 UNITS tablet Take 1,000 Units by mouth daily.   Yes Historical Provider, MD  diclofenac (VOLTAREN) 50 MG EC tablet Take 50 mg by mouth at bedtime.   Yes Historical Provider, MD  estradiol (VIVELLE-DOT) 0.05 MG/24HR patch Place 1 patch onto the skin 2 (two) times a week. Tues and Saturday.   Yes Historical Provider, MD  ibuprofen (ADVIL,MOTRIN) 200 MG tablet Take 800 mg by mouth daily as needed for moderate pain.   Yes Historical Provider, MD  levothyroxine (SYNTHROID, LEVOTHROID) 88 MCG tablet Take 88 mcg by mouth daily.   Yes Historical Provider, MD  pyridOXINE (VITAMIN B-6) 100 MG tablet Take 100 mg by mouth daily.   Yes Historical Provider, MD  traZODone (DESYREL) 50 MG tablet Take 100-300 mg by mouth at  bedtime.    Yes Historical Provider, MD  progesterone (PROMETRIUM) 200 MG capsule TAKE 1 CAPSULE(S) EVERY DAY BY ORAL ROUTE FOR 14 NIGHTS EVERY OTHER MONTH 06/20/15   Historical Provider, MD     History reviewed. No pertinent family history.  Social History   Social History  . Marital Status: Married    Spouse Name: N/A  . Number of Children: 3  . Years of Education: N/A   Occupational History  . interior designer    Social History Main Topics  . Smoking status: Current Every Day Smoker -- 0.80 packs/day  . Smokeless tobacco: Never Used  . Alcohol Use: Yes     Comment: socially  . Drug Use: None  . Sexual Activity: Not Asked   Other Topics Concern  . None   Social History Narrative    Review of Systems: A 12 point ROS discussed and pertinent positives are indicated in the HPI above.  All other systems are negative.  Review of Systems  Vital Signs: BP 140/76 mmHg  Pulse 82  Temp(Src) 98.8 F (37.1 C) (Oral)  Resp 17  Ht 5\' 8"  (1.727 m)  Wt 148 lb (67.132 kg)  BMI 22.51 kg/m2  SpO2 95%  Physical Exam  Constitutional: She is oriented to person, place, and time. She appears well-developed and well-nourished. No distress.  HENT:  Head: Normocephalic.  Mouth/Throat: Oropharynx is clear and moist.  Neck: Normal range of motion. No JVD present. No tracheal deviation present.  Cardiovascular: Normal rate, regular rhythm and normal heart sounds.   Pulmonary/Chest: Effort normal and breath sounds normal. No respiratory distress.  Abdominal: Soft. She exhibits mass. There is no tenderness.  Neurological: She is alert and oriented to person, place, and time.  Psychiatric: She has a normal mood and affect. Judgment normal.    Mallampati Score:  MD Evaluation Airway: WNL Heart: WNL Abdomen: WNL Chest/ Lungs: WNL ASA  Classification: 2 Mallampati/Airway Score: Two  Imaging: Ct Abdomen Pelvis Wo Contrast  07/20/2015  CLINICAL DATA:  Right lower quadrant pain EXAM:  CT ABDOMEN AND PELVIS WITHOUT CONTRAST TECHNIQUE: Multidetector CT imaging of the abdomen and pelvis was performed following the standard protocol without IV contrast. COMPARISON:  None. FINDINGS: Innumerable cysts are seen in both kidneys. There are some rim calcifications associated with left renal cysts. The largest right renal cyst is 13 cm. Previously this was reported at 11.5 cm, therefore it has enlarged. Moderate right hydronephrosis is present. There is right-sided perinephric fluid density. No obvious ureteral calculus can be identified. There is an apparent rind of soft tissue surrounding the aorta. There is no soft tissue posterior to the aorta. This does extend to the iliac vasculature region. Appendix is within normal limits. 5 mm sub solid left lower lobe pulmonary nodule posteriorly is partially imaged on image 1. Liver, gallbladder, spleen, pancreas, adrenal glands are within normal limits. Diverticulosis throughout much of the colon. Sigmoid is decompressed Bladder, uterus, and adnexa are unremarkable. No vertebral compression deformity. IMPRESSION: Right hydronephrosis and perinephric stranding have developed. These are secondary findings of ureteral obstruction. No ureteral calculus can be seen. There is an on enlarging right renal cyst which may be compressing the right ureter. There is also a soft tissue rind surrounding the aorta which may represent retroperitoneal fibrosis or possibly lymphoma. CT abdomen and pelvis with contrast is recommended to further characterize, and is recommended as the first next step. The soft tissue rind will be better evaluated by this. Drainage of the right renal cyst may improve drainage of the right kidney. Retrograde pyelogram can be performed to evaluate for obstructing lesion in the right ureter. 5 mm sub solid nodule in the left lower lobe. Initial follow-up by chest CT without contrast is recommended in 3 months to confirm persistence. This recommendation  follows the consensus statement: Recommendations for the Management of Subsolid Pulmonary Nodules Detected at CT: A Statement from the [employer] as published in Radiology 2013; 266:304-317. Electronically Signed   By: Marybelle Killings M.D.   On: 07/20/2015 08:34    Labs:  CBC:  Recent Labs  07/20/15 1607 07/21/15 0415  WBC 11.7* 11.0*  HGB 13.3 12.8  HCT 38.5 37.9  PLT 414* 415*    COAGS:  Recent Labs  07/21/15 0415  INR 1.08    BMP:  Recent Labs  07/20/15 1607 07/21/15 0415  NA 137 140  K 4.1 4.5  CL 105 111  CO2 25 23  GLUCOSE 124* 102*  BUN 26* 32*  CALCIUM 9.6 9.5  CREATININE 2.38* 2.27*  GFRNONAA 21* 22*  GFRAA 24* 25*    LIVER FUNCTION TESTS:  Recent Labs  07/20/15 1630 07/21/15 0415  BILITOT 0.5 0.5  AST 21 17  ALT 25 28  ALKPHOS 85 80  PROT 6.9 6.4*  ALBUMIN 3.2* 2.9*    TUMOR MARKERS: No results for input(s): AFPTM, CEA, CA199, CHROMGRNA in the last 8760 hours.  Assessment and Plan: Large symptomatic right renal cyst. For US aspiration Labs  reviewed, ok Risks and Benefits discussed with the patient including bleeding, infection, damage to adjacent structures, bowel perforation/fistula connection, and sepsis. All of the patient's questions were answered, patient is agreeable to proceed. Consent signed and in chart.    Thank you for this interesting consult.   A copy of this report was sent to the requesting provider on this date.  SignedAscencion Dike 07/21/2015, 9:25 AM   I spent a total of 20 Minutes  in face to face in clinical consultation, greater than 50% of which was counseling/coordinating care for right renal cyst aspiration

## 2015-07-21 NOTE — Progress Notes (Signed)
Discharge instructions given to patient and family. D Treysen Sudbeck RN 

## 2015-07-21 NOTE — Progress Notes (Signed)
1330 bandaid to right abdomen dry and intact D [employer]

## 2015-07-21 NOTE — Discharge Summary (Signed)
Discharge Summary  Jennifer George [id number] DOB: [birth year]  PCP: Geoffery Lyons, MD  Admit date: 07/20/2015 Discharge date: 07/21/2015  Time spent: >93mins, more than 50% of time spent on coordination of care.  Recommendations for Outpatient Follow-up:  1. F/u with PMD on Monday to repeat bmp, pmd to adjust synthroid dose as well. 2. F/u with urology and interventional radiology as described below  Discharge Diagnoses:  Active Hospital Problems   Diagnosis Date Noted  . Acute kidney injury ([place]) 07/20/2015  . Renal cyst, right 07/20/2015    Resolved Hospital Problems   Diagnosis Date Noted Date Resolved  No resolved problems to display.    Discharge Condition: stable  Diet recommendation: heart healthy  Filed Weights   07/20/15 1954  Weight: 148 lb (67.132 kg)    History of present illness:  Jennifer George is a 64 y.o. female   With history of cystic kidney disease, has multiple IR drain of the cyst, last in 2012 started to have right lower abdominal pain a few days ago, Ct ab/pel done by pmd showed eright hydronephrosis and peinephric stranding, enlargement of right kidney cyst may be compressing the right ureter, she was sent to the ED for further eval, initial labs showed she has ARF with cr of 2.38. UA unremarkable.  She reported decreased oral intake for the last few days , she has been on diclofenac for about a year, was on bid for 72months, then changed to daily, she denies fever, no n/v, no cough. She smokes cigarette daily. EDP consulted urology, urology recommended IR drain of the renal cyst. hospitalist called to admit the patient.  Hospital Course:  Active Problems:   Acute kidney injury ([place])   Renal cyst, right  smyptomatic renal cyst/h/o cystic kidney disease: prn pain control, s?p US guided drain of right renal cyst with removal of 900cc of fluids. Patient report feeling much better after the drain.  ARF: baseline cr wnl, cr 2.38 on  admission, ua unremarkable, d/c NSAIDs, start ivf, repeat bmp in am cr at 2.27, patient is advised to remain in the hospital to follow up on creatinine level and to be evaluated by urology, however, patient insisted on going home, she understands she must have bmp repeated on Monday and the need to be evaluated by urology. She understands to come back to the hospital if she does not feel well.   Hypothyroidism:  tsh 0.217. Defer to pmd to adjust synthroid.  Smoker: smoking cessation education provided, patient reported not ready to quit, she initially agreed to nicotine patch , but then declined it.  Retroperitoneal fibrosis?  repeat ct with contrast was recommended, will need to normalize cr first. Patient is to have bmp rechecked on Monday.   44mm solid left lower lobe lung nodule; repeat ct chest without contrast in 13months is recommended.    Consultants: Interventional radiology Dr [facility] Urology Dr. Diona Fanti  Code Status: full   Family Communication: Patient and husband   Discharge Exam: BP 129/68 mmHg  Pulse 76  Temp(Src) 98 F (36.7 C) (Oral)  Resp 16  Ht 5\' 8"  (1.727 m)  Wt 148 lb (67.132 kg)  BMI 22.51 kg/m2  SpO2 95%   General: NAD, anxious  Eyes: PERRL  ENT: unremarkable  Neck: supple, no JVD  Cardiovascular: RRR  Respiratory: CTABL  Abdomen: fullness right lower quadrant has much improved, tenderness has resolved, no rebound, soft, positive bowel sounds  Skin: no rash  Musculoskeletal: No edema  Psychiatric: anxious,  Neurologic: no focal findings    Discharge Instructions You were cared for by a hospitalist during your hospital stay. If you have any questions about your discharge medications or the care you received while you were in the hospital after you are discharged, you can call the unit and asked to speak with the hospitalist on call if the hospitalist that took care of you is not available. Once you are discharged, your primary care  physician will handle any further medical issues. Please note that NO REFILLS for any discharge medications will be authorized once you are discharged, as it is imperative that you return to your primary care physician (or establish a relationship with a primary care physician if you do not have one) for your aftercare needs so that they can reassess your need for medications and monitor your lab values.  Discharge Instructions    Diet - low sodium heart healthy    Complete by:  As directed      Increase activity slowly    Complete by:  As directed             Medication List    STOP taking these medications        diclofenac 50 MG EC tablet  Commonly known as:  VOLTAREN     ibuprofen 200 MG tablet  Commonly known as:  ADVIL,MOTRIN      TAKE these medications        aspirin 81 MG tablet  Take 81 mg by mouth daily.     atorvastatin 20 MG tablet  Commonly known as:  LIPITOR  Take 20 mg by mouth daily.     bifidobacterium infantis capsule  Take 1 capsule by mouth daily.     cholecalciferol 1000 UNITS tablet  Commonly known as:  VITAMIN D  Take 1,000 Units by mouth daily.     estradiol 0.05 MG/24HR patch  Commonly known as:  VIVELLE-DOT  Place 1 patch onto the skin 2 (two) times a week. Tues and Saturday.     levothyroxine 88 MCG tablet  Commonly known as:  SYNTHROID, LEVOTHROID  Take 88 mcg by mouth daily.     progesterone 200 MG capsule  Commonly known as:  PROMETRIUM  TAKE 1 CAPSULE(S) EVERY DAY BY ORAL ROUTE FOR 14 NIGHTS EVERY OTHER MONTH     pyridOXINE 100 MG tablet  Commonly known as:  VITAMIN B-6  Take 100 mg by mouth daily.     traZODone 50 MG tablet  Commonly known as:  DESYREL  Take 100-300 mg by mouth at bedtime.       No Known Allergies     Follow-up Information    Follow up with HOSS, ART A, MD.   Specialty:  Interventional Radiology   Why:  interventional radiology ,As needed   Contact information:   [address]  [place] [address] [phone]       Follow up with ARONSON,RICHARD A, MD On 07/24/2015.   Specialty:  Internal Medicine   Why:  repeat bmp at follow up   Contact information:   [address] [place] [address] [phone]       Follow up with Jorja Loa, MD In 1 week.   Specialty:  Urology   Why:  urology follow up for cystic kidney disease/ right sided hydronephrosis/retroperitoneal fibrosis?   Contact information:   [address] [place] [address] [phone]        The results of significant  diagnostics from this hospitalization (including imaging, microbiology, ancillary and laboratory) are listed below for reference.    Significant Diagnostic Studies: Ct Abdomen Pelvis Wo Contrast  07/20/2015  CLINICAL DATA:  Right lower quadrant pain EXAM: CT ABDOMEN AND PELVIS WITHOUT CONTRAST TECHNIQUE: Multidetector CT imaging of the abdomen and pelvis was performed following the standard protocol without IV contrast. COMPARISON:  None. FINDINGS: Innumerable cysts are seen in both kidneys. There are some rim calcifications associated with left renal cysts. The largest right renal cyst is 13 cm. Previously this was reported at 11.5 cm, therefore it has enlarged. Moderate right hydronephrosis is present. There is right-sided perinephric fluid density. No obvious ureteral calculus can be identified. There is an apparent rind of soft tissue surrounding the aorta. There is no soft tissue posterior to the aorta. This does extend to the iliac vasculature region. Appendix is within normal limits. 5 mm sub solid left lower lobe pulmonary nodule posteriorly is partially imaged on image 1. Liver, gallbladder, spleen, pancreas, adrenal glands are within normal limits. Diverticulosis throughout much of the colon. Sigmoid is decompressed Bladder, uterus, and adnexa are unremarkable. No vertebral compression deformity. IMPRESSION: Right hydronephrosis and perinephric stranding have  developed. These are secondary findings of ureteral obstruction. No ureteral calculus can be seen. There is an on enlarging right renal cyst which may be compressing the right ureter. There is also a soft tissue rind surrounding the aorta which may represent retroperitoneal fibrosis or possibly lymphoma. CT abdomen and pelvis with contrast is recommended to further characterize, and is recommended as the first next step. The soft tissue rind will be better evaluated by this. Drainage of the right renal cyst may improve drainage of the right kidney. Retrograde pyelogram can be performed to evaluate for obstructing lesion in the right ureter. 5 mm sub solid nodule in the left lower lobe. Initial follow-up by chest CT without contrast is recommended in 3 months to confirm persistence. This recommendation follows the consensus statement: Recommendations for the Management of Subsolid Pulmonary Nodules Detected at CT: A Statement from the [employer] as published in Radiology 2013; 266:304-317. Electronically Signed   By: Marybelle Killings M.D.   On: 07/20/2015 08:34   US Aspiration  07/21/2015  CLINICAL DATA:  Large right renal cyst EXAM: US ASPIRATION FLUOROSCOPY TIME:  None MEDICATIONS AND MEDICAL HISTORY: Versed Three mg, Fentanyl 100 mcg. Additional Medications: None. ANESTHESIA/SEDATION: Moderate sedation time: 13 minutes CONTRAST:  None PROCEDURE: The procedure, risks, benefits, and alternatives were explained to the patient. Questions regarding the procedure were encouraged and answered. The patient understands and consents to the procedure. The right lower quadrant was prepped with Betadine in a sterile fashion, and a sterile drape was applied covering the operative field. A sterile gown and sterile gloves were used for the procedure. Under sonographic guidance, a Safe-T-Centesis needle was advanced into the large right renal cyst. 900 cc dark clear fluid was aspirated without complication. FINDINGS: Images  document catheter placement within the right renal cyst. Post aspiration images demonstrate resolution of the fluid. COMPLICATIONS: None IMPRESSION: Successful ultrasound-guided aspiration of a large right renal cyst yielding 900 cc. Electronically Signed   By: Marybelle Killings M.D.   On: 07/21/2015 14:02    Microbiology: Recent Results (from the past 240 hour(s))  Gram stain     Status: None   Collection Time: 07/21/15 12:14 PM  Result Value Ref Range Status   Specimen Description FLUID RIGHT RENAL CYSTS  Final   Special Requests NONE  Final   Gram Stain   Final    RARE WBC PRESENT,BOTH PMN AND MONONUCLEAR NO ORGANISMS SEEN Performed at [facility]    Report Status 07/21/2015 FINAL  Final     Labs: Basic Metabolic Panel:  Recent Labs Lab 07/20/15 1607 07/21/15 0415  NA 137 140  K 4.1 4.5  CL 105 111  CO2 25 23  GLUCOSE 124* 102*  BUN 26* 32*  CREATININE 2.38* 2.27*  CALCIUM 9.6 9.5   Liver Function Tests:  Recent Labs Lab 07/20/15 1630 07/21/15 0415  AST 21 17  ALT 25 28  ALKPHOS 85 80  BILITOT 0.5 0.5  PROT 6.9 6.4*  ALBUMIN 3.2* 2.9*   No results for input(s): LIPASE, AMYLASE in the last 168 hours. No results for input(s): AMMONIA in the last 168 hours. CBC:  Recent Labs Lab 07/20/15 1607 07/21/15 0415  WBC 11.7* 11.0*  NEUTROABS 8.4*  --   HGB 13.3 12.8  HCT 38.5 37.9  MCV 87.9 88.3  PLT 414* 415*   Cardiac Enzymes: No results for input(s): CKTOTAL, CKMB, CKMBINDEX, TROPONINI in the last 168 hours. BNP: BNP (last 3 results) No results for input(s): BNP in the last 8760 hours.  ProBNP (last 3 results) No results for input(s): PROBNP in the last 8760 hours.  CBG: No results for input(s): GLUCAP in the last 168 hours.     SignedFlorencia Reasons MD, PhD  [facility] 07/21/2015, 3:54 PM

## 2015-07-22 ENCOUNTER — Inpatient Hospital Stay (HOSPITAL_COMMUNITY)
Admission: EM | Admit: 2015-07-22 | Discharge: 2015-07-23 | DRG: 699 | Disposition: A | Discharge status: Home / Self Care | Payer: BLUE CROSS/BLUE SHIELD | Attending: Internal Medicine | Admitting: Internal Medicine

## 2015-07-22 ENCOUNTER — Emergency Department (HOSPITAL_COMMUNITY): Payer: BLUE CROSS/BLUE SHIELD

## 2015-07-22 ENCOUNTER — Observation Stay (HOSPITAL_COMMUNITY): Payer: BLUE CROSS/BLUE SHIELD

## 2015-07-22 ENCOUNTER — Encounter (HOSPITAL_COMMUNITY): Payer: Self-pay | Admitting: Emergency Medicine

## 2015-07-22 DIAGNOSIS — N179 Acute kidney failure, unspecified: Secondary | ICD-10-CM | POA: Diagnosis present

## 2015-07-22 DIAGNOSIS — N1330 Unspecified hydronephrosis: Secondary | ICD-10-CM | POA: Diagnosis present

## 2015-07-22 DIAGNOSIS — N135 Crossing vessel and stricture of ureter without hydronephrosis: Secondary | ICD-10-CM | POA: Diagnosis present

## 2015-07-22 DIAGNOSIS — K5730 Diverticulosis of large intestine without perforation or abscess without bleeding: Secondary | ICD-10-CM | POA: Diagnosis present

## 2015-07-22 DIAGNOSIS — E785 Hyperlipidemia, unspecified: Secondary | ICD-10-CM | POA: Diagnosis present

## 2015-07-22 DIAGNOSIS — E039 Hypothyroidism, unspecified: Secondary | ICD-10-CM | POA: Diagnosis present

## 2015-07-22 DIAGNOSIS — Q6100 Congenital renal cyst, unspecified: Secondary | ICD-10-CM | POA: Diagnosis present

## 2015-07-22 DIAGNOSIS — F17200 Nicotine dependence, unspecified, uncomplicated: Secondary | ICD-10-CM | POA: Diagnosis present

## 2015-07-22 DIAGNOSIS — Q619 Cystic kidney disease, unspecified: Secondary | ICD-10-CM | POA: Diagnosis not present

## 2015-07-22 DIAGNOSIS — N281 Cyst of kidney, acquired: Secondary | ICD-10-CM

## 2015-07-22 DIAGNOSIS — Z7982 Long term (current) use of aspirin: Secondary | ICD-10-CM | POA: Diagnosis not present

## 2015-07-22 DIAGNOSIS — R911 Solitary pulmonary nodule: Secondary | ICD-10-CM | POA: Diagnosis present

## 2015-07-22 LAB — COMPREHENSIVE METABOLIC PANEL
ALT: 46 U/L (ref 14–54)
AST: 27 U/L (ref 15–41)
Albumin: 3.2 g/dL — ABNORMAL LOW (ref 3.5–5.0)
Alkaline Phosphatase: 82 U/L (ref 38–126)
Anion gap: 8 (ref 5–15)
BUN: 27 mg/dL — ABNORMAL HIGH (ref 6–20)
CO2: 21 mmol/L — ABNORMAL LOW (ref 22–32)
Calcium: 9.7 mg/dL (ref 8.9–10.3)
Chloride: 112 mmol/L — ABNORMAL HIGH (ref 101–111)
Creatinine, Ser: 1.54 mg/dL — ABNORMAL HIGH (ref 0.44–1.00)
GFR calc Af Amer: 41 mL/min — ABNORMAL LOW (ref >60)
GFR calc non Af Amer: 35 mL/min — ABNORMAL LOW (ref >60)
Glucose, Bld: 120 mg/dL — ABNORMAL HIGH (ref 65–99)
Potassium: 4.1 mmol/L (ref 3.5–5.1)
Sodium: 141 mmol/L (ref 135–145)
Total Bilirubin: 0.5 mg/dL (ref 0.3–1.2)
Total Protein: 6.9 g/dL (ref 6.5–8.1)

## 2015-07-22 LAB — URINALYSIS, ROUTINE W REFLEX MICROSCOPIC
Bilirubin Urine: NEGATIVE
Glucose, UA: NEGATIVE mg/dL
Ketones, ur: NEGATIVE mg/dL
Leukocytes, UA: NEGATIVE
Nitrite: NEGATIVE
Protein, ur: NEGATIVE mg/dL
Specific Gravity, Urine: 1.013 (ref 1.005–1.030)
pH: 5.5 (ref 5.0–8.0)

## 2015-07-22 LAB — DIFFERENTIAL
Basophils Absolute: 0.1 10*3/uL (ref 0.0–0.1)
Basophils Relative: 1 %
Eosinophils Absolute: 0.2 10*3/uL (ref 0.0–0.7)
Eosinophils Relative: 2 %
Lymphocytes Relative: 12 %
Lymphs Abs: 1.5 10*3/uL (ref 0.7–4.0)
Monocytes Absolute: 0.9 10*3/uL (ref 0.1–1.0)
Monocytes Relative: 7 %
Neutro Abs: 10.3 10*3/uL — ABNORMAL HIGH (ref 1.7–7.7)
Neutrophils Relative %: 80 %

## 2015-07-22 LAB — LIPASE, BLOOD: Lipase: 26 U/L (ref 11–51)

## 2015-07-22 LAB — CBC
HCT: 40 % (ref 36.0–46.0)
Hemoglobin: 13.1 g/dL (ref 12.0–15.0)
MCH: 29.4 pg (ref 26.0–34.0)
MCHC: 32.8 g/dL (ref 30.0–36.0)
MCV: 89.7 fL (ref 78.0–100.0)
Platelets: 467 10*3/uL — ABNORMAL HIGH (ref 150–400)
RBC: 4.46 MIL/uL (ref 3.87–5.11)
RDW: 14.2 % (ref 11.5–15.5)
WBC: 13.4 10*3/uL — ABNORMAL HIGH (ref 4.0–10.5)

## 2015-07-22 LAB — URINE MICROSCOPIC-ADD ON

## 2015-07-22 MED ORDER — ONDANSETRON HCL 4 MG/2ML IJ SOLN
4.0000 mg | Freq: Four times a day (QID) | INTRAMUSCULAR | Status: DC | PRN
  Administered 2015-07-22 – 2015-07-23 (×2): 4 mg via INTRAVENOUS
  Filled 2015-07-22: qty 2

## 2015-07-22 MED ORDER — ESTRADIOL 0.05 MG/24HR TD PTTW
0.0500 mg | MEDICATED_PATCH | TRANSDERMAL | Status: DC
Start: 2015-07-25 — End: 2015-07-23

## 2015-07-22 MED ORDER — ALIGN 4 MG PO CAPS
1.0000 | ORAL_CAPSULE | Freq: Every day | ORAL | Status: DC
  Administered 2015-07-22 – 2015-07-23 (×2): 4 mg via ORAL
  Filled 2015-07-22 (×2): qty 1

## 2015-07-22 MED ORDER — MORPHINE SULFATE (PF) 2 MG/ML IV SOLN
2.0000 mg | INTRAVENOUS | Status: DC | PRN
  Administered 2015-07-22: 2 mg via INTRAVENOUS
  Filled 2015-07-22: qty 1

## 2015-07-22 MED ORDER — SIMETHICONE 80 MG PO CHEW
80.0000 mg | CHEWABLE_TABLET | Freq: Four times a day (QID) | ORAL | Status: DC | PRN
  Administered 2015-07-22: 80 mg via ORAL
  Filled 2015-07-22 (×2): qty 1

## 2015-07-22 MED ORDER — ATORVASTATIN CALCIUM 20 MG PO TABS
20.0000 mg | ORAL_TABLET | Freq: Every day | ORAL | Status: DC
  Administered 2015-07-23: 20 mg via ORAL
  Filled 2015-07-22: qty 1

## 2015-07-22 MED ORDER — MORPHINE SULFATE (PF) 4 MG/ML IV SOLN
4.0000 mg | Freq: Once | INTRAVENOUS | Status: AC
  Administered 2015-07-22: 4 mg via INTRAVENOUS
  Filled 2015-07-22: qty 1

## 2015-07-22 MED ORDER — LEVOTHYROXINE SODIUM 88 MCG PO TABS
88.0000 ug | ORAL_TABLET | Freq: Every day | ORAL | Status: DC
  Administered 2015-07-23: 88 ug via ORAL
  Filled 2015-07-22 (×2): qty 1

## 2015-07-22 MED ORDER — SODIUM CHLORIDE 0.9 % IV SOLN
INTRAVENOUS | Status: DC
  Administered 2015-07-22 – 2015-07-23 (×2): via INTRAVENOUS

## 2015-07-22 MED ORDER — ONDANSETRON HCL 4 MG PO TABS
4.0000 mg | ORAL_TABLET | Freq: Four times a day (QID) | ORAL | Status: DC | PRN
  Administered 2015-07-22 – 2015-07-23 (×3): 4 mg via ORAL
  Filled 2015-07-22 (×3): qty 1

## 2015-07-22 MED ORDER — MIDAZOLAM HCL 2 MG/2ML IJ SOLN
INTRAMUSCULAR | Status: AC | PRN
  Administered 2015-07-22: 1 mg via INTRAVENOUS

## 2015-07-22 MED ORDER — VITAMIN D3 25 MCG (1000 UNIT) PO TABS
1000.0000 [IU] | ORAL_TABLET | Freq: Every day | ORAL | Status: DC
  Administered 2015-07-22 – 2015-07-23 (×2): 1000 [IU] via ORAL
  Filled 2015-07-22 (×2): qty 1

## 2015-07-22 MED ORDER — VITAMIN B-6 100 MG PO TABS
100.0000 mg | ORAL_TABLET | Freq: Every day | ORAL | Status: DC
  Administered 2015-07-22 – 2015-07-23 (×2): 100 mg via ORAL
  Filled 2015-07-22 (×2): qty 1

## 2015-07-22 MED ORDER — PROGESTERONE MICRONIZED 200 MG PO CAPS
200.0000 mg | ORAL_CAPSULE | Freq: Every day | ORAL | Status: DC
  Administered 2015-07-22: 200 mg via ORAL
  Filled 2015-07-22 (×2): qty 1

## 2015-07-22 MED ORDER — FENTANYL CITRATE (PF) 100 MCG/2ML IJ SOLN
INTRAMUSCULAR | Status: AC | PRN
  Administered 2015-07-22: 50 ug via INTRAVENOUS

## 2015-07-22 MED ORDER — MORPHINE SULFATE (PF) 4 MG/ML IV SOLN
4.0000 mg | INTRAVENOUS | Status: DC | PRN
  Administered 2015-07-22: 4 mg via INTRAVENOUS
  Filled 2015-07-22: qty 1

## 2015-07-22 MED ORDER — ONDANSETRON HCL 4 MG/2ML IJ SOLN
4.0000 mg | Freq: Once | INTRAMUSCULAR | Status: AC
  Administered 2015-07-22: 4 mg via INTRAVENOUS
  Filled 2015-07-22: qty 2

## 2015-07-22 MED ORDER — LORATADINE 10 MG PO TABS
10.0000 mg | ORAL_TABLET | Freq: Every day | ORAL | Status: DC
  Administered 2015-07-22 – 2015-07-23 (×2): 10 mg via ORAL
  Filled 2015-07-22 (×2): qty 1

## 2015-07-22 MED ORDER — SODIUM CHLORIDE 0.9 % IV SOLN
INTRAVENOUS | Status: AC
  Administered 2015-07-22: 11:00:00 via INTRAVENOUS

## 2015-07-22 MED ORDER — TRAZODONE HCL 100 MG PO TABS
200.0000 mg | ORAL_TABLET | Freq: Every day | ORAL | Status: DC
  Administered 2015-07-22: 200 mg via ORAL
  Filled 2015-07-22 (×2): qty 2

## 2015-07-22 MED ORDER — OXYCODONE HCL 5 MG PO TABS
5.0000 mg | ORAL_TABLET | ORAL | Status: DC | PRN
  Administered 2015-07-22 – 2015-07-23 (×4): 5 mg via ORAL
  Filled 2015-07-22 (×4): qty 1

## 2015-07-22 MED ORDER — HYDROMORPHONE HCL 1 MG/ML IJ SOLN
1.0000 mg | INTRAMUSCULAR | Status: DC | PRN
  Administered 2015-07-22: 1 mg via INTRAVENOUS
  Filled 2015-07-22: qty 1

## 2015-07-22 MED ORDER — MIDAZOLAM HCL 2 MG/2ML IJ SOLN
INTRAMUSCULAR | Status: AC
  Filled 2015-07-22: qty 6

## 2015-07-22 MED ORDER — ACETAMINOPHEN 650 MG RE SUPP: 650.0000 mg | Freq: Four times a day (QID) | RECTAL | Status: DC | PRN

## 2015-07-22 MED ORDER — FENTANYL CITRATE (PF) 100 MCG/2ML IJ SOLN
INTRAMUSCULAR | Status: AC
  Filled 2015-07-22: qty 6

## 2015-07-22 MED ORDER — ASPIRIN 81 MG PO CHEW
81.0000 mg | CHEWABLE_TABLET | Freq: Every day | ORAL | Status: DC
  Administered 2015-07-23: 81 mg via ORAL
  Filled 2015-07-22: qty 1

## 2015-07-22 MED ORDER — DICYCLOMINE HCL 10 MG PO CAPS
10.0000 mg | ORAL_CAPSULE | Freq: Two times a day (BID) | ORAL | Status: DC | PRN
  Filled 2015-07-22: qty 1

## 2015-07-22 MED ORDER — ACETAMINOPHEN 325 MG PO TABS: 650.0000 mg | ORAL_TABLET | Freq: Four times a day (QID) | ORAL | Status: DC | PRN

## 2015-07-22 NOTE — ED Provider Notes (Signed)
CSN: [id number]     Arrival date & time 07/22/15  0430 History   First MD Initiated Contact with Patient 07/22/15 [phone]     Chief Complaint  Patient presents with  . Post-op Problem     (Consider location/radiation/quality/duration/timing/severity/associated sxs/prior Treatment) HPI Comments: Patient with a history of polycystic kidney disease with recent hospitalization for interventional aspiration of cystic fluid. She was discharged yesterday afternoon in improved condition. She reports last night around 9:00 pm she started to feel pain in the right abdomen extending to right flank similar to multiple previous episodes of pain related to her kidney disease. The pain is waxing and waning, currently improved compared to onset.  No nausea, vomiting or fever. She feels there is a recurrent , palpable mass adjacent to the umbilicus where it was drained the day before. She reports that her cysts have been drained multiple times in the past without any post-procedure complications or re-accumulation.   The history is provided by the patient. No language interpreter was used.    Past Medical History  Diagnosis Date  . Morton's neuroma 2000    removed  . Hypothyroidism   . Depression    Past Surgical History  Procedure Laterality Date  . Dilation and curettage of uterus  1981 & 1982   History reviewed. No pertinent family history. Social History  Substance Use Topics  . Smoking status: Current Every Day Smoker -- 0.80 packs/day  . Smokeless tobacco: Never Used  . Alcohol Use: Yes     Comment: socially   OB History    No data available     Review of Systems  Constitutional: Negative for fever.  Respiratory: Negative for shortness of breath.   Cardiovascular: Negative for chest pain.  Gastrointestinal: Positive for abdominal pain. Negative for nausea.  Genitourinary: Positive for flank pain.  Musculoskeletal: Negative for myalgias.  Skin: Negative for color change.  Neurological:  Negative for weakness and headaches.      Allergies  Review of patient's allergies indicates no known allergies.  Home Medications   Prior to Admission medications   Medication Sig Start Date End Date Taking? Authorizing Provider  aspirin 81 MG tablet Take 81 mg by mouth daily.   Yes Historical Provider, MD  atorvastatin (LIPITOR) 20 MG tablet Take 20 mg by mouth daily. 06/17/15  Yes Historical Provider, MD  bifidobacterium infantis (ALIGN) capsule Take 1 capsule by mouth daily.   Yes Historical Provider, MD  cholecalciferol (VITAMIN D) 1000 UNITS tablet Take 1,000 Units by mouth daily.   Yes Historical Provider, MD  estradiol (VIVELLE-DOT) 0.05 MG/24HR patch Place 1 patch onto the skin 2 (two) times a week. Tues and Saturday.   Yes Historical Provider, MD  fexofenadine (ALLEGRA) 180 MG tablet Take 180 mg by mouth daily.   Yes Historical Provider, MD  levothyroxine (SYNTHROID, LEVOTHROID) 88 MCG tablet Take 88 mcg by mouth daily.   Yes Historical Provider, MD  progesterone (PROMETRIUM) 200 MG capsule TAKE 1 CAPSULE(S) EVERY DAY BY ORAL ROUTE FOR 14 NIGHTS EVERY OTHER MONTH 06/20/15  Yes Historical Provider, MD  pyridOXINE (VITAMIN B-6) 100 MG tablet Take 100 mg by mouth daily.   Yes Historical Provider, MD  traZODone (DESYREL) 100 MG tablet Take 200 mg by mouth at bedtime. 07/06/15  Yes Historical Provider, MD   BP 129/103 mmHg  Pulse 69  Temp(Src) 97.6 F (36.4 C) (Oral)  Resp 20  Ht 5\' 8"  (1.727 m)  Wt 67.132 kg  BMI 22.51 kg/m2  SpO2  99% Physical Exam  Constitutional: She is oriented to person, place, and time. She appears well-developed and well-nourished.  HENT:  Head: Normocephalic.  Neck: Normal range of motion. Neck supple.  Cardiovascular: Normal rate and regular rhythm.   Pulmonary/Chest: Effort normal and breath sounds normal.  Abdominal: Soft. Bowel sounds are normal. There is tenderness. There is no rebound and no guarding.  Palpable soft mass in right  periumbilical area that is tender.   Genitourinary:  No right CVA tenderness.  Musculoskeletal: Normal range of motion.  Neurological: She is alert and oriented to person, place, and time.  Skin: Skin is warm and dry. No rash noted.  Psychiatric: She has a normal mood and affect.    ED Course  Procedures (including critical care time) Labs Review Labs Reviewed  CBC - Abnormal; Notable for the following:    WBC 13.4 (*)    Platelets 467 (*)    All other components within normal limits  LIPASE, BLOOD  COMPREHENSIVE METABOLIC PANEL  URINALYSIS, ROUTINE W REFLEX MICROSCOPIC (NOT AT [facility])   Results for orders placed or performed during the hospital encounter of 07/22/15  Lipase, blood  Result Value Ref Range   Lipase 26 11 - 51 U/L  Comprehensive metabolic panel  Result Value Ref Range   Sodium 141 135 - 145 mmol/L   Potassium 4.1 3.5 - 5.1 mmol/L   Chloride 112 (H) 101 - 111 mmol/L   CO2 21 (L) 22 - 32 mmol/L   Glucose, Bld 120 (H) 65 - 99 mg/dL   BUN 27 (H) 6 - 20 mg/dL   Creatinine, Ser 1.54 (H) 0.44 - 1.00 mg/dL   Calcium 9.7 8.9 - 10.3 mg/dL   Total Protein 6.9 6.5 - 8.1 g/dL   Albumin 3.2 (L) 3.5 - 5.0 g/dL   AST 27 15 - 41 U/L   ALT 46 14 - 54 U/L   Alkaline Phosphatase 82 38 - 126 U/L   Total Bilirubin 0.5 0.3 - 1.2 mg/dL   GFR calc non Af Amer 35 (L) >60 mL/min   GFR calc Af Amer 41 (L) >60 mL/min   Anion gap 8 5 - 15  CBC  Result Value Ref Range   WBC 13.4 (H) 4.0 - 10.5 K/uL   RBC 4.46 3.87 - 5.11 MIL/uL   Hemoglobin 13.1 12.0 - 15.0 g/dL   HCT 40.0 36.0 - 46.0 %   MCV 89.7 78.0 - 100.0 fL   MCH 29.4 26.0 - 34.0 pg   MCHC 32.8 30.0 - 36.0 g/dL   RDW 14.2 11.5 - 15.5 %   Platelets 467 (H) 150 - 400 K/uL     Imaging Review US Aspiration  07/21/2015  CLINICAL DATA:  Large right renal cyst EXAM: US ASPIRATION FLUOROSCOPY TIME:  None MEDICATIONS AND MEDICAL HISTORY: Versed Three mg, Fentanyl 100 mcg. Additional Medications: None. ANESTHESIA/SEDATION:  Moderate sedation time: 13 minutes CONTRAST:  None PROCEDURE: The procedure, risks, benefits, and alternatives were explained to the patient. Questions regarding the procedure were encouraged and answered. The patient understands and consents to the procedure. The right lower quadrant was prepped with Betadine in a sterile fashion, and a sterile drape was applied covering the operative field. A sterile gown and sterile gloves were used for the procedure. Under sonographic guidance, a Safe-T-Centesis needle was advanced into the large right renal cyst. 900 cc dark clear fluid was aspirated without complication. FINDINGS: Images document catheter placement within the right renal cyst. Post aspiration images demonstrate resolution  of the fluid. COMPLICATIONS: None IMPRESSION: Successful ultrasound-guided aspiration of a large right renal cyst yielding 900 cc. Electronically Signed   By: Marybelle Killings M.D.   On: 07/21/2015 14:02   I have personally reviewed and evaluated these images and lab results as part of my medical decision-making.   EKG Interpretation None      MDM   Final diagnoses:  None    1. Abdominal pain  Patient receiving ultrasound to evaluate potential reaccumulation of cystic fluid which would require further intervention.    Patient care left with Dr. Christy Gentles at end of shift pending [place] in evaluation of renal cysts.    Charlann Lange, PA-C 07/24/15 [id number]  Ripley Fraise, MD 07/24/15 [phone]

## 2015-07-22 NOTE — ED Provider Notes (Signed)
D/w patient Her pain is worsening It appears her cysts are reaccumulating and now has hydro Will admit pt I spoke to radiology and they will help arrange another IR drainage Dr Darl Householder will call report to [facility]   Ripley Fraise, MD 07/22/15 [phone]

## 2015-07-22 NOTE — ED Notes (Signed)
Pt reports increase in pain; would like something to keep it down.

## 2015-07-22 NOTE — ED Notes (Signed)
Pt arrived to the ED with a complaint of right sided flank and right lower abdominal pain.  Pt states she had a kidney cyst drained yesterday.  Pt states pain similar to the pain prior to drainage of the cyst.  Pt has been having symptoms since 0100 hrs.

## 2015-07-22 NOTE — Procedures (Signed)
R renal cyst drain Yellow clear fluid. No comp

## 2015-07-22 NOTE — Consult Note (Signed)
Urology Consult   Physician requesting consult: D. Darl Householder  Reason for consult: Renal cyst  History of Present Illness: Jennifer George is a 64 y.o. female  with a history of polycystic kidney disease. She has not been seen by urologist in the past, but previous to this admission has had 4 separate percutaneous cyst drainage procedures. Most recently, one day ago, she had a dominant right lower pole cyst drained of 900 mL of fluid. The patient was discharged from the hospital last night, but on the way home actually had recurrent right flank and back pain reminiscent of the pain that she had prior to cyst drainage. Ultrasound performed upon admission to the emergency room today revealed recurrence of a right lower pole cyst. She is admitted for management, and urologic consultation is requested. Prior cyst drainage procedures have been ordered by the patient's nephrologists in the past and also recently by Dr. Burnard Bunting.  She denies a history of voiding or storage urinary symptoms, hematuria, UTIs, STDs, urolithiasis, GU malignancy/trauma/surgery.  Past Medical History  Diagnosis Date  . Morton's neuroma 2000    removed  . Hypothyroidism   . Depression     Past Surgical History  Procedure Laterality Date  . Dilation and curettage of uterus  [facility] Medications: Scheduled Meds: . sodium chloride   Intravenous STAT   Continuous Infusions: . sodium chloride     PRN Meds:.acetaminophen **OR** acetaminophen, morphine injection, ondansetron **OR** ondansetron (ZOFRAN) IV  Allergies: No Known Allergies  History reviewed. No pertinent family history.  Social History:  reports that she has been smoking.  She has never used smokeless tobacco. She reports that she drinks alcohol. Her drug history is not on file.  ROS: Positive for right flank pain, right back pain. No history of gross hematuria, nausea, vomiting, fever or chills. A complete review of systems  was performed.  All systems are negative except for pertinent findings as noted.  Physical Exam:  Vital signs in last 24 hours: Temp:  [97.6 F (36.4 C)-98.1 F (36.7 C)] 98.1 F (36.7 C) (12/03 0922) Pulse Rate:  [64-85] 72 (12/03 0922) Resp:  [12-20] 18 (12/03 0922) BP: (111-162)/(61-103) 129/64 mmHg (12/03 0922) SpO2:  [93 %-99 %] 97 % (12/03 0922) Weight:  [67.132 kg (148 lb)] 67.132 kg (148 lb) (12/03 0437) General:  Alert and oriented, No acute distress HEENT: Normocephalic, atraumatic Neck: No JVD or lymphadenopathy Lungs: Clear bilaterally Abdomen: Soft, rounded, right-sided abdominal tenderness, no rebound or guarding. Back: No CVA tenderness Extremities: No edema Neurologic: Grossly intact  Laboratory Data:   Recent Labs  07/20/15 1607 07/21/15 0415 07/22/15 0522  WBC 11.7* 11.0* 13.4*  HGB 13.3 12.8 13.1  HCT 38.5 37.9 40.0  PLT 414* 415* 467*     Recent Labs  07/20/15 1607 07/21/15 0415 07/22/15 0522  NA 137 140 141  K 4.1 4.5 4.1  CL 105 111 112*  GLUCOSE 124* 102* 120*  BUN 26* 32* 27*  CALCIUM 9.6 9.5 9.7  CREATININE 2.38* 2.27* 1.54*     Results for orders placed or performed during the hospital encounter of 07/22/15 (from the past 24 hour(s))  Lipase, blood     Status: None   Collection Time: 07/22/15  5:22 AM  Result Value Ref Range   Lipase 26 11 - 51 U/L  Comprehensive metabolic panel     Status: Abnormal   Collection Time: 07/22/15  5:22 AM  Result Value Ref Range  Sodium 141 135 - 145 mmol/L   Potassium 4.1 3.5 - 5.1 mmol/L   Chloride 112 (H) 101 - 111 mmol/L   CO2 21 (L) 22 - 32 mmol/L   Glucose, Bld 120 (H) 65 - 99 mg/dL   BUN 27 (H) 6 - 20 mg/dL   Creatinine, Ser 1.54 (H) 0.44 - 1.00 mg/dL   Calcium 9.7 8.9 - 10.3 mg/dL   Total Protein 6.9 6.5 - 8.1 g/dL   Albumin 3.2 (L) 3.5 - 5.0 g/dL   AST 27 15 - 41 U/L   ALT 46 14 - 54 U/L   Alkaline Phosphatase 82 38 - 126 U/L   Total Bilirubin 0.5 0.3 - 1.2 mg/dL   GFR calc non  Af Amer 35 (L) >60 mL/min   GFR calc Af Amer 41 (L) >60 mL/min   Anion gap 8 5 - 15  CBC     Status: Abnormal   Collection Time: 07/22/15  5:22 AM  Result Value Ref Range   WBC 13.4 (H) 4.0 - 10.5 K/uL   RBC 4.46 3.87 - 5.11 MIL/uL   Hemoglobin 13.1 12.0 - 15.0 g/dL   HCT 40.0 36.0 - 46.0 %   MCV 89.7 78.0 - 100.0 fL   MCH 29.4 26.0 - 34.0 pg   MCHC 32.8 30.0 - 36.0 g/dL   RDW 14.2 11.5 - 15.5 %   Platelets 467 (H) 150 - 400 K/uL  Differential     Status: Abnormal   Collection Time: 07/22/15  5:22 AM  Result Value Ref Range   Neutrophils Relative % 80 %   Neutro Abs 10.3 (H) 1.7 - 7.7 K/uL   Lymphocytes Relative 12 %   Lymphs Abs 1.5 0.7 - 4.0 K/uL   Monocytes Relative 7 %   Monocytes Absolute 0.9 0.1 - 1.0 K/uL   Eosinophils Relative 2 %   Eosinophils Absolute 0.2 0.0 - 0.7 K/uL   Basophils Relative 1 %   Basophils Absolute 0.1 0.0 - 0.1 K/uL  Urinalysis, Routine w reflex microscopic (not at [facility])     Status: Abnormal   Collection Time: 07/22/15  5:57 AM  Result Value Ref Range   Color, Urine YELLOW YELLOW   APPearance CLOUDY (A) CLEAR   Specific Gravity, Urine 1.013 1.005 - 1.030   pH 5.5 5.0 - 8.0   Glucose, UA NEGATIVE NEGATIVE mg/dL   Hgb urine dipstick LARGE (A) NEGATIVE   Bilirubin Urine NEGATIVE NEGATIVE   Ketones, ur NEGATIVE NEGATIVE mg/dL   Protein, ur NEGATIVE NEGATIVE mg/dL   Nitrite NEGATIVE NEGATIVE   Leukocytes, UA NEGATIVE NEGATIVE  Urine microscopic-add on     Status: Abnormal   Collection Time: 07/22/15  5:57 AM  Result Value Ref Range   Squamous Epithelial / LPF 0-5 (A) NONE SEEN   WBC, UA 0-5 0 - 5 WBC/hpf   RBC / HPF TOO NUMEROUS TO COUNT 0 - 5 RBC/hpf   Bacteria, UA RARE (A) NONE SEEN   Recent Results (from the past 240 hour(s))  Gram stain     Status: None   Collection Time: 07/21/15 12:14 PM  Result Value Ref Range Status   Specimen Description FLUID RIGHT RENAL CYSTS  Final   Special Requests NONE  Final   Gram Stain   Final    RARE  WBC PRESENT,BOTH PMN AND MONONUCLEAR NO ORGANISMS SEEN Performed at [facility]    Report Status 07/21/2015 FINAL  Final    Renal Function:  Recent Labs  07/20/15 1607 07/21/15 0415 07/22/15 0522  CREATININE 2.38* 2.27* 1.54*   Estimated Creatinine Clearance: 38.2 mL/min (by C-G formula based on Cr of 1.54).  Radiologic Imaging: US Renal  07/22/2015  CLINICAL DATA:  Multiple renal cysts. Recent renal cyst drainage. Evaluate for hydronephrosis. Increasing pain overnight. EXAM: RENAL / URINARY TRACT ULTRASOUND COMPLETE COMPARISON:  Renal ultrasound dated 07/21/2015. FINDINGS: Right Kidney: Length: 15.3 cm. There is moderate right-sided hydronephrosis. Multiple cysts are seen within the right kidney, largest being a complex cyst with thick internal septations located exophytic to the lower pole measuring 12.4 x 11.5 x 12 cm. No renal stone identified. Left Kidney: Length: 16.6 cm. No left-sided hydronephrosis seen. There are multiple cysts within the left kidney, largest of which is a simple - appearing cyst within the midpole region measuring 6.5 x 5.3 x 5.9 cm. No renal stone identified. Bladder: Bladder is unremarkable. Both distal ureters shown to be patent at the level of the bladder (bilateral ureteral jets visualized). IMPRESSION: 1. Moderate right-sided hydronephrosis, difficult to definitively characterize due to the numerous right renal cysts. 2. Multiple renal cysts bilaterally. Largest right renal cyst is complex with thick internal septations located exophytic to the lower pole measuring 12.4 x 11.5 x 12 cm. It is unclear if this is the same renal cyst that was aspirated yesterday with interval reaccumulation. At minimum, would consider follow-up renal ultrasound or CT at some point to ensure benignity of this dominant complex cyst. Electronically Signed   By: Franki Cabot M.D.   On: 07/22/2015 07:15   US Aspiration  07/21/2015  CLINICAL DATA:  Large right renal cyst EXAM:  US ASPIRATION FLUOROSCOPY TIME:  None MEDICATIONS AND MEDICAL HISTORY: Versed Three mg, Fentanyl 100 mcg. Additional Medications: None. ANESTHESIA/SEDATION: Moderate sedation time: 13 minutes CONTRAST:  None PROCEDURE: The procedure, risks, benefits, and alternatives were explained to the patient. Questions regarding the procedure were encouraged and answered. The patient understands and consents to the procedure. The right lower quadrant was prepped with Betadine in a sterile fashion, and a sterile drape was applied covering the operative field. A sterile gown and sterile gloves were used for the procedure. Under sonographic guidance, a Safe-T-Centesis needle was advanced into the large right renal cyst. 900 cc dark clear fluid was aspirated without complication. FINDINGS: Images document catheter placement within the right renal cyst. Post aspiration images demonstrate resolution of the fluid. COMPLICATIONS: None IMPRESSION: Successful ultrasound-guided aspiration of a large right renal cyst yielding 900 cc. Electronically Signed   By: Marybelle Killings M.D.   On: 07/21/2015 14:02   CT scan images were reviewed. Additionally, most recent renal ultrasound images were also reviewed. There is a question of retroperitoneal fibrosis. There is no left-sided hydronephrosis. Multiple bilateral cysts noted, the dominant cyst in the right lower pole. There is minimal right hydronephrosis. I independently reviewed the above imaging studies.  Impression/Assessment:  1. Polycystic kidneys, with painful right lower pole cyst, recurrent following cyst drainage yesterday  2. Right hydronephrosis, mild-question related to possible retroperitoneal fibrosis versus compression from cyst  3. Possible retroperitoneal fibrosis  Plan:  1. I had an extended discussion with the patient and her husband, who was in the room. We talked about urgent need for drainage of the right renal cyst, leaving a drain in. This will by [place] time to  decide whether to proceed with sclerosis of the cyst in the near future versus laparoscopic procedure to unroofed the cyst. I have spoken with Dr. Maryclare Bean of interventional  radiology today. Hopefully, we can proceed with cyst drainage with leaving an indwelling percutaneous drain  2. I  would recommend, following cyst drainage, and the next few days, contrasted CT to evaluate drainage of the right and left kidneys, and evaluate the need for possible cystoscopy, retrograde and stent placement if right-sided hydronephrosis persists  3. We will continue to follow during her course. I would suggest, if she has the drain placed, and is comfortable, discharged to home, and we will perform her CT scan early in the week  Cc: Dr. Darl Householder

## 2015-07-22 NOTE — ED Notes (Signed)
Hospitalist at bedside; will transfer with completion of assessment.

## 2015-07-22 NOTE — Progress Notes (Signed)
Referring Physician(s): Dahlstedt  Chief Complaint:  Recurrent Right Renal Cyst Polycystic Kidney Disease  Subjective:  Jennifer George returned to the hospital early this morning c/o severe right sided pain.  She underwent an US guided aspiration of a right renal cyst by Dr. Barbie Banner yesterday and was doing fine until late last night, early this morning she began having more pain.  Renal US shows probably re-accumulation of the cyst  Allergies: Review of patient's allergies indicates no known allergies.  Medications: Prior to Admission medications   Medication Sig Start Date End Date Taking? Authorizing Provider  aspirin 81 MG tablet Take 81 mg by mouth daily.   Yes Historical Provider, MD  atorvastatin (LIPITOR) 20 MG tablet Take 20 mg by mouth daily. 06/17/15  Yes Historical Provider, MD  bifidobacterium infantis (ALIGN) capsule Take 1 capsule by mouth daily.   Yes Historical Provider, MD  cholecalciferol (VITAMIN D) 1000 UNITS tablet Take 1,000 Units by mouth daily.   Yes Historical Provider, MD  estradiol (VIVELLE-DOT) 0.05 MG/24HR patch Place 1 patch onto the skin 2 (two) times a week. Tues and Saturday.   Yes Historical Provider, MD  fexofenadine (ALLEGRA) 180 MG tablet Take 180 mg by mouth daily.   Yes Historical Provider, MD  levothyroxine (SYNTHROID, LEVOTHROID) 88 MCG tablet Take 88 mcg by mouth daily.   Yes Historical Provider, MD  progesterone (PROMETRIUM) 200 MG capsule TAKE 1 CAPSULE(S) EVERY DAY BY ORAL ROUTE FOR 14 NIGHTS EVERY OTHER MONTH 06/20/15  Yes Historical Provider, MD  pyridOXINE (VITAMIN B-6) 100 MG tablet Take 100 mg by mouth daily.   Yes Historical Provider, MD  traZODone (DESYREL) 100 MG tablet Take 200 mg by mouth at bedtime. 07/06/15  Yes Historical Provider, MD     Vital Signs: BP 129/64 mmHg  Pulse 72  Temp(Src) 98.1 F (36.7 C) (Oral)  Resp 18  Ht 5\' 8"  (1.727 m)  Wt 148 lb (67.132 kg)  BMI 22.51 kg/m2  SpO2 97%  Physical Exam    Constitutional: She is oriented to person, place, and time. She appears well-developed and well-nourished.  HENT:  Head: Atraumatic.  Eyes: EOM are normal.  Neck: Normal range of motion. Neck supple.  Cardiovascular: Normal rate, regular rhythm and normal heart sounds.   Pulmonary/Chest: Effort normal and breath sounds normal.  Abdominal: Soft. Bowel sounds are normal. She exhibits no distension. There is no tenderness.  Musculoskeletal: Normal range of motion.  Neurological: She is alert and oriented to person, place, and time.  Skin: Skin is warm and dry.  Psychiatric: She has a normal mood and affect. Her behavior is normal. Judgment and thought content normal.  Vitals reviewed.    Mallampati Score:  MD Evaluation Airway: WNL Heart: WNL Abdomen: WNL Chest/ Lungs: WNL ASA Classification: 2 Mallampati/Airway Score: Two  Imaging: Ct Abdomen Pelvis Wo Contrast  07/20/2015  CLINICAL DATA:  Right lower quadrant pain EXAM: CT ABDOMEN AND PELVIS WITHOUT CONTRAST TECHNIQUE: Multidetector CT imaging of the abdomen and pelvis was performed following the standard protocol without IV contrast. COMPARISON:  None. FINDINGS: Innumerable cysts are seen in both kidneys. There are some rim calcifications associated with left renal cysts. The largest right renal cyst is 13 cm. Previously this was reported at 11.5 cm, therefore it has enlarged. Moderate right hydronephrosis is present. There is right-sided perinephric fluid density. No obvious ureteral calculus can be identified. There is an apparent rind of soft tissue surrounding the aorta. There is no soft tissue posterior to the  aorta. This does extend to the iliac vasculature region. Appendix is within normal limits. 5 mm sub solid left lower lobe pulmonary nodule posteriorly is partially imaged on image 1. Liver, gallbladder, spleen, pancreas, adrenal glands are within normal limits. Diverticulosis throughout much of the colon. Sigmoid is  decompressed Bladder, uterus, and adnexa are unremarkable. No vertebral compression deformity. IMPRESSION: Right hydronephrosis and perinephric stranding have developed. These are secondary findings of ureteral obstruction. No ureteral calculus can be seen. There is an on enlarging right renal cyst which may be compressing the right ureter. There is also a soft tissue rind surrounding the aorta which may represent retroperitoneal fibrosis or possibly lymphoma. CT abdomen and pelvis with contrast is recommended to further characterize, and is recommended as the first next step. The soft tissue rind will be better evaluated by this. Drainage of the right renal cyst may improve drainage of the right kidney. Retrograde pyelogram can be performed to evaluate for obstructing lesion in the right ureter. 5 mm sub solid nodule in the left lower lobe. Initial follow-up by chest CT without contrast is recommended in 3 months to confirm persistence. This recommendation follows the consensus statement: Recommendations for the Management of Subsolid Pulmonary Nodules Detected at CT: A Statement from the [employer] as published in Radiology 2013; 266:304-317. Electronically Signed   By: Marybelle Killings M.D.   On: 07/20/2015 08:34   US Renal  07/22/2015  CLINICAL DATA:  Multiple renal cysts. Recent renal cyst drainage. Evaluate for hydronephrosis. Increasing pain overnight. EXAM: RENAL / URINARY TRACT ULTRASOUND COMPLETE COMPARISON:  Renal ultrasound dated 07/21/2015. FINDINGS: Right Kidney: Length: 15.3 cm. There is moderate right-sided hydronephrosis. Multiple cysts are seen within the right kidney, largest being a complex cyst with thick internal septations located exophytic to the lower pole measuring 12.4 x 11.5 x 12 cm. No renal stone identified. Left Kidney: Length: 16.6 cm. No left-sided hydronephrosis seen. There are multiple cysts within the left kidney, largest of which is a simple - appearing cyst within the  midpole region measuring 6.5 x 5.3 x 5.9 cm. No renal stone identified. Bladder: Bladder is unremarkable. Both distal ureters shown to be patent at the level of the bladder (bilateral ureteral jets visualized). IMPRESSION: 1. Moderate right-sided hydronephrosis, difficult to definitively characterize due to the numerous right renal cysts. 2. Multiple renal cysts bilaterally. Largest right renal cyst is complex with thick internal septations located exophytic to the lower pole measuring 12.4 x 11.5 x 12 cm. It is unclear if this is the same renal cyst that was aspirated yesterday with interval reaccumulation. At minimum, would consider follow-up renal ultrasound or CT at some point to ensure benignity of this dominant complex cyst. Electronically Signed   By: Franki Cabot M.D.   On: 07/22/2015 07:15   US Aspiration  07/21/2015  CLINICAL DATA:  Large right renal cyst EXAM: US ASPIRATION FLUOROSCOPY TIME:  None MEDICATIONS AND MEDICAL HISTORY: Versed Three mg, Fentanyl 100 mcg. Additional Medications: None. ANESTHESIA/SEDATION: Moderate sedation time: 13 minutes CONTRAST:  None PROCEDURE: The procedure, risks, benefits, and alternatives were explained to the patient. Questions regarding the procedure were encouraged and answered. The patient understands and consents to the procedure. The right lower quadrant was prepped with Betadine in a sterile fashion, and a sterile drape was applied covering the operative field. A sterile gown and sterile gloves were used for the procedure. Under sonographic guidance, a Safe-T-Centesis needle was advanced into the large right renal cyst. 900 cc dark clear fluid  was aspirated without complication. FINDINGS: Images document catheter placement within the right renal cyst. Post aspiration images demonstrate resolution of the fluid. COMPLICATIONS: None IMPRESSION: Successful ultrasound-guided aspiration of a large right renal cyst yielding 900 cc. Electronically Signed   By: Marybelle Killings M.D.   On: 07/21/2015 14:02    Labs:  CBC:  Recent Labs  07/20/15 1607 07/21/15 0415 07/22/15 0522  WBC 11.7* 11.0* 13.4*  HGB 13.3 12.8 13.1  HCT 38.5 37.9 40.0  PLT 414* 415* 467*    COAGS:  Recent Labs  07/21/15 0415  INR 1.08    BMP:  Recent Labs  07/20/15 1607 07/21/15 0415 07/22/15 0522  NA 137 140 141  K 4.1 4.5 4.1  CL 105 111 112*  CO2 25 23 21*  GLUCOSE 124* 102* 120*  BUN 26* 32* 27*  CALCIUM 9.6 9.5 9.7  CREATININE 2.38* 2.27* 1.54*  GFRNONAA 21* 22* 35*  GFRAA 24* 25* 41*    LIVER FUNCTION TESTS:  Recent Labs  07/20/15 1630 07/21/15 0415 07/22/15 0522  BILITOT 0.5 0.5 0.5  AST 21 17 27   ALT 25 28 46  ALKPHOS 85 80 82  PROT 6.9 6.4* 6.9  ALBUMIN 3.2* 2.9* 3.2*     Assessment and Plan:  Polycystic kidney disease Possible re-accumulation of the previously aspirated right renal cyst.  Risks and Benefits discussed with the patient including, but not limited to bleeding, infection, damage to adjacent structures.  All of the patient's questions were answered, patient is agreeable to proceed. Consent signed and in chart.   Signed: Murrell Redden PA-C 07/22/2015, 10:34 AM   I spent a total of 15 Minutes at the the patient's bedside AND on the patient's hospital floor or unit, greater than 50% of which was counseling/coordinating care for US guided drainage of recurrent right renal cyst.

## 2015-07-22 NOTE — ED Provider Notes (Signed)
  Physical Exam  BP 112/85 mmHg  Pulse 85  Temp(Src) 97.6 F (36.4 C) (Oral)  Resp 18  Ht 5\' 8"  (1.727 m)  Wt 148 lb (67.132 kg)  BMI 22.51 kg/m2  SpO2 94%  Physical Exam  ED Course  Procedures  MDM Patient care assumed at sign out. Patient recently discharged and had IR drainage of the cyst. However, had more pain last night. Labs improved. US showed recurrent hydro. IR to drain the cyst again. Hospitalist to admit for pain control. Consulted urology to see patient.   8:02 AM  IR called back. States that will not attempt another IR drainage. Recommend urology consult first. Consulted Dr. Frederico Hamman from urology, who will see patient.   Wandra Arthurs, MD 07/22/15 [id number]

## 2015-07-22 NOTE — H&P (Signed)
[facility] History and Physical  Jennifer George [id number] DOB: Feb 01, 1951 DOA: 07/22/2015  Referring physician: Dr. Shirlyn Goltz, EDP PCP: Geoffery Lyons, MD  Specialists:   Chief Complaint: Abdominal pain  HPI: Jennifer George is a 64 y.o. female  With a history of multicystic kidney disease, hypothyroidism, tobacco abuse, that presented to the emergency department with complaints of abdominal pain. Patient was recently discharged 07/21/2015. She was admitted with acute kidney injury symptomatic renal cyst which was drained, 900 mL by interventional radiology. Patient had improved and was discharged, with outpatient urology follow-up. Upon arrival to home, patient started to have more pain in her abdomen with swelling noted on the right side. Patient admits that in the past she has had several drainages of the cyst and usually the fluid does not return quickly. In the emergency department, ultrasound was performed showing recurrence of the right lower pole cyst. She currently denies any chest pain or shortness of breath, nausea or vomiting, dizziness or headache. She does complain of some abdominal cramping and change in her bowel pattern. Although she states that she has had poor appetite recently. [facility] was called for admission.  Review of Systems:  Constitutional: Poor appetite. HEENT: Denies photophobia, eye pain, redness, hearing loss, ear pain, congestion, sore throat, rhinorrhea, sneezing, mouth sores, trouble swallowing, neck pain, neck stiffness and tinnitus.   Respiratory: Denies SOB, DOE, cough, chest tightness,  and wheezing.   Cardiovascular: Denies chest pain, palpitations and leg swelling.  Gastrointestinal: Abdominal pain and swelling. Genitourinary: Denies dysuria, urgency, frequency, hematuria, flank pain and difficulty urinating.  Musculoskeletal: Denies myalgias, back pain, joint swelling, arthralgias and gait problem.  Skin: Denies pallor, rash and wound.    Neurological: Denies dizziness, seizures, syncope, weakness, light-headedness, numbness and headaches.  Hematological: Denies adenopathy. Easy bruising, personal or family bleeding history  Psychiatric/Behavioral: Denies suicidal ideation, mood changes, confusion, nervousness, sleep disturbance and agitation  Past Medical History  Diagnosis Date  . Morton's neuroma 2000    removed  . Hypothyroidism   . Depression    Past Surgical History  Procedure Laterality Date  . Dilation and curettage of uterus  1981 & 1982   Social History:  reports that she has been smoking.  She has never used smokeless tobacco. She reports that she drinks alcohol. Her drug history is not on file.  No Known Allergies  History reviewed. No pertinent family history.  Patient was adopted and doe snot know her family history.  Prior to Admission medications   Medication Sig Start Date End Date Taking? Authorizing Provider  aspirin 81 MG tablet Take 81 mg by mouth daily.   Yes Historical Provider, MD  atorvastatin (LIPITOR) 20 MG tablet Take 20 mg by mouth daily. 06/17/15  Yes Historical Provider, MD  bifidobacterium infantis (ALIGN) capsule Take 1 capsule by mouth daily.   Yes Historical Provider, MD  cholecalciferol (VITAMIN D) 1000 UNITS tablet Take 1,000 Units by mouth daily.   Yes Historical Provider, MD  estradiol (VIVELLE-DOT) 0.05 MG/24HR patch Place 1 patch onto the skin 2 (two) times a week. Tues and Saturday.   Yes Historical Provider, MD  fexofenadine (ALLEGRA) 180 MG tablet Take 180 mg by mouth daily.   Yes Historical Provider, MD  levothyroxine (SYNTHROID, LEVOTHROID) 88 MCG tablet Take 88 mcg by mouth daily.   Yes Historical Provider, MD  progesterone (PROMETRIUM) 200 MG capsule TAKE 1 CAPSULE(S) EVERY DAY BY ORAL ROUTE FOR 14 NIGHTS EVERY OTHER MONTH 06/20/15  Yes Historical Provider, MD  pyridOXINE (VITAMIN B-6) 100 MG tablet Take 100 mg by mouth daily.   Yes Historical Provider, MD  traZODone  (DESYREL) 100 MG tablet Take 200 mg by mouth at bedtime. 07/06/15  Yes Historical Provider, MD   Physical Exam: Filed Vitals:   07/22/15 0725 07/22/15 0922  BP: 112/85 129/64  Pulse: 85 72  Temp:  98.1 F (36.7 C)  Resp: 18 18     General: Well developed, well nourished, NAD, appears stated age  HEENT: NCAT, PERRLA, EOMI, Anicteic Sclera, mucous membranes moist.   Neck: Supple, no JVD, no masses  Cardiovascular: S1 S2 auscultated, no rubs, murmurs or gallops. Regular rate and rhythm.  Respiratory: Clear to auscultation bilaterally with equal chest rise  Abdomen: Soft, right side TTP, mild right sided distension, + bowel sounds  Extremities: warm dry without cyanosis clubbing or edema  Neuro: AAOx3, cranial nerves grossly intact. Strength 5/5 in patient's upper and lower extremities bilaterally  Skin: Without rashes exudates or nodules  Psych: Normal affect and demeanor with intact judgement and insight  Labs on Admission:  Basic Metabolic Panel:  Recent Labs Lab 07/20/15 1607 07/21/15 0415 07/22/15 0522  NA 137 140 141  K 4.1 4.5 4.1  CL 105 111 112*  CO2 25 23 21*  GLUCOSE 124* 102* 120*  BUN 26* 32* 27*  CREATININE 2.38* 2.27* 1.54*  CALCIUM 9.6 9.5 9.7   Liver Function Tests:  Recent Labs Lab 07/20/15 1630 07/21/15 0415 07/22/15 0522  AST 21 17 27   ALT 25 28 46  ALKPHOS 85 80 82  BILITOT 0.5 0.5 0.5  PROT 6.9 6.4* 6.9  ALBUMIN 3.2* 2.9* 3.2*    Recent Labs Lab 07/22/15 0522  LIPASE 26   No results for input(s): AMMONIA in the last 168 hours. CBC:  Recent Labs Lab 07/20/15 1607 07/21/15 0415 07/22/15 0522  WBC 11.7* 11.0* 13.4*  NEUTROABS 8.4*  --  10.3*  HGB 13.3 12.8 13.1  HCT 38.5 37.9 40.0  MCV 87.9 88.3 89.7  PLT 414* 415* 467*   Cardiac Enzymes: No results for input(s): CKTOTAL, CKMB, CKMBINDEX, TROPONINI in the last 168 hours.  BNP (last 3 results) No results for input(s): BNP in the last 8760 hours.  ProBNP (last 3  results) No results for input(s): PROBNP in the last 8760 hours.  CBG: No results for input(s): GLUCAP in the last 168 hours.  Radiological Exams on Admission: US Renal  07/22/2015  CLINICAL DATA:  Multiple renal cysts. Recent renal cyst drainage. Evaluate for hydronephrosis. Increasing pain overnight. EXAM: RENAL / URINARY TRACT ULTRASOUND COMPLETE COMPARISON:  Renal ultrasound dated 07/21/2015. FINDINGS: Right Kidney: Length: 15.3 cm. There is moderate right-sided hydronephrosis. Multiple cysts are seen within the right kidney, largest being a complex cyst with thick internal septations located exophytic to the lower pole measuring 12.4 x 11.5 x 12 cm. No renal stone identified. Left Kidney: Length: 16.6 cm. No left-sided hydronephrosis seen. There are multiple cysts within the left kidney, largest of which is a simple - appearing cyst within the midpole region measuring 6.5 x 5.3 x 5.9 cm. No renal stone identified. Bladder: Bladder is unremarkable. Both distal ureters shown to be patent at the level of the bladder (bilateral ureteral jets visualized). IMPRESSION: 1. Moderate right-sided hydronephrosis, difficult to definitively characterize due to the numerous right renal cysts. 2. Multiple renal cysts bilaterally. Largest right renal cyst is complex with thick internal septations located exophytic to the lower pole measuring 12.4 x 11.5 x 12 cm. It  is unclear if this is the same renal cyst that was aspirated yesterday with interval reaccumulation. At minimum, would consider follow-up renal ultrasound or CT at some point to ensure benignity of this dominant complex cyst. Electronically Signed   By: Franki Cabot M.D.   On: 07/22/2015 07:15   US Aspiration  07/21/2015  CLINICAL DATA:  Large right renal cyst EXAM: US ASPIRATION FLUOROSCOPY TIME:  None MEDICATIONS AND MEDICAL HISTORY: Versed Three mg, Fentanyl 100 mcg. Additional Medications: None. ANESTHESIA/SEDATION: Moderate sedation time: 13 minutes  CONTRAST:  None PROCEDURE: The procedure, risks, benefits, and alternatives were explained to the patient. Questions regarding the procedure were encouraged and answered. The patient understands and consents to the procedure. The right lower quadrant was prepped with Betadine in a sterile fashion, and a sterile drape was applied covering the operative field. A sterile gown and sterile gloves were used for the procedure. Under sonographic guidance, a Safe-T-Centesis needle was advanced into the large right renal cyst. 900 cc dark clear fluid was aspirated without complication. FINDINGS: Images document catheter placement within the right renal cyst. Post aspiration images demonstrate resolution of the fluid. COMPLICATIONS: None IMPRESSION: Successful ultrasound-guided aspiration of a large right renal cyst yielding 900 cc. Electronically Signed   By: Marybelle Killings M.D.   On: 07/21/2015 14:02    EKG: None  Assessment/Plan  Recurrent right hydronephrosis/multicystic kidney disease -Patient will be admitted for observation -Urology consulted and appreciated -Interventional radiology consultation appreciated -Patient recently admitted and discharged 07/21/2015 after having right renal cyst drained by interventional radiology. -Patient may need repeat drainage of the renal cyst -Pending this, patient will need to follow-up with urology as an outpatient for CT scan early in the week as well as possible cystoscopy  Intractable pain -Likely secondary to the above -Continue pain control  Acute kidney injury -Creatinine does appear to be much improved from discharge on 12/2, currently 1.5 -Continue to monitor BMP  Tobacco abuse -Discussed smoking cessation  ? Retroperitoneal fibrosis -Will need outpatient follow-up with urology  5 mm solid left lung nodule -Repeat CT chest without contrast in 3 months  Hypothyroidism -Continue Synthroid, patient will need follow-up with her primary care  physician regarding dosing  Hyperlipidemia -Continue statin  Abdominal cramping and gas -Will place on simethicone and bentyl PRN -Possible due to IBS?  DVT prophylaxis: SCDs  Code Status: Full  Condition: Guarded  Family Communication: Husband at bedside Admission, patients condition and plan of care including tests being ordered have been discussed with the patient and husband who indicate understanding and agree with the plan and Code Status.  Disposition Plan: Admitted for observation   Time spent: 65 minutes  Najmah Carradine D.O. [facility] Pager [phone]  If 7PM-7AM, please contact night-coverage [email or web address] Password [email or web address] 07/22/2015, 10:21 AM

## 2015-07-23 ENCOUNTER — Encounter (HOSPITAL_COMMUNITY): Payer: Self-pay | Admitting: Radiology

## 2015-07-23 ENCOUNTER — Inpatient Hospital Stay (HOSPITAL_COMMUNITY): Payer: BLUE CROSS/BLUE SHIELD

## 2015-07-23 LAB — CBC
HCT: 36.9 % (ref 36.0–46.0)
Hemoglobin: 11.9 g/dL — ABNORMAL LOW (ref 12.0–15.0)
MCH: 29.5 pg (ref 26.0–34.0)
MCHC: 32.2 g/dL (ref 30.0–36.0)
MCV: 91.3 fL (ref 78.0–100.0)
Platelets: 462 10*3/uL — ABNORMAL HIGH (ref 150–400)
RBC: 4.04 MIL/uL (ref 3.87–5.11)
RDW: 14.3 % (ref 11.5–15.5)
WBC: 8.7 10*3/uL (ref 4.0–10.5)

## 2015-07-23 LAB — BASIC METABOLIC PANEL
Anion gap: 5 (ref 5–15)
BUN: 17 mg/dL (ref 6–20)
CO2: 23 mmol/L (ref 22–32)
Calcium: 8.9 mg/dL (ref 8.9–10.3)
Chloride: 112 mmol/L — ABNORMAL HIGH (ref 101–111)
Creatinine, Ser: 1.1 mg/dL — ABNORMAL HIGH (ref 0.44–1.00)
GFR calc Af Amer: >60 mL/min (ref >60)
GFR calc non Af Amer: 53 mL/min — ABNORMAL LOW (ref >60)
Glucose, Bld: 95 mg/dL (ref 65–99)
Potassium: 4.6 mmol/L (ref 3.5–5.1)
Sodium: 140 mmol/L (ref 135–145)

## 2015-07-23 LAB — CREATININE, URINE, RANDOM: Creatinine, Urine: 35.12 mg/dL

## 2015-07-23 MED ORDER — ONDANSETRON HCL 4 MG PO TABS: 4.0000 mg | ORAL_TABLET | Freq: Four times a day (QID) | ORAL | Status: DC | PRN

## 2015-07-23 MED ORDER — OXYCODONE HCL 5 MG PO TABS: 5.0000 mg | ORAL_TABLET | ORAL | Status: DC | PRN

## 2015-07-23 MED ORDER — IOHEXOL 300 MG/ML  SOLN
75.0000 mL | Freq: Once | INTRAMUSCULAR | Status: AC | PRN
  Administered 2015-07-23: 75 mL via INTRAVENOUS

## 2015-07-23 MED ORDER — SIMETHICONE 80 MG PO CHEW: 80.0000 mg | CHEWABLE_TABLET | Freq: Four times a day (QID) | ORAL | Status: DC | PRN

## 2015-07-23 NOTE — Progress Notes (Signed)
Subjective: Patient reports much less pain. She has had a significant amount of drain output  Objective: Vital signs in last 24 hours: Temp:  [98 F (36.7 C)-98.2 F (36.8 C)] 98 F (36.7 C) (12/04 0615) Pulse Rate:  [62-92] 65 (12/04 0615) Resp:  [13-20] 18 (12/04 0615) BP: (119-136)/(53-73) 131/73 mmHg (12/04 0615) SpO2:  [93 %-99 %] 96 % (12/04 0615) Weight:  [65.681 kg (144 lb 12.8 oz)] 65.681 kg (144 lb 12.8 oz) (12/04 0615)  Intake/Output from previous day: 12/03 0701 - 12/04 0700 In: 1123.8 [P.O.:360; I.V.:763.8] Out: 1865 [Drains:1865] Intake/Output this shift:    Physical Exam:  Constitutional: Vital signs reviewed. WD WN in NAD   Eyes: PERRL, No scleral icterus.   Pulmonary/Chest: Normal effort Abdominal: Soft. Non-tender, non-distended, bowel sounds are normal, no masses, organomegaly, or guarding present.    Lab Results:  Recent Labs  07/21/15 0415 07/22/15 0522 07/23/15 0520  HGB 12.8 13.1 11.9*  HCT 37.9 40.0 36.9   BMET  Recent Labs  07/22/15 0522 07/23/15 0520  NA 141 140  K 4.1 4.6  CL 112* 112*  CO2 21* 23  GLUCOSE 120* 95  BUN 27* 17  CREATININE 1.54* 1.10*  CALCIUM 9.7 8.9    Recent Labs  07/21/15 0415  INR 1.08   No results for input(s): LABURIN in the last 72 hours. Results for orders placed or performed during the hospital encounter of 07/20/15  Culture, body fluid-bottle     Status: None (Preliminary result)   Collection Time: 07/21/15 12:14 PM  Result Value Ref Range Status   Specimen Description FLUID RIGHT RENAL CYSTS  Final   Special Requests NONE  Final   Culture   Final    NO GROWTH 1 DAY Performed at [facility]    Report Status PENDING  Incomplete  Gram stain     Status: None   Collection Time: 07/21/15 12:14 PM  Result Value Ref Range Status   Specimen Description FLUID RIGHT RENAL CYSTS  Final   Special Requests NONE  Final   Gram Stain   Final    RARE WBC PRESENT,BOTH PMN AND MONONUCLEAR NO  ORGANISMS SEEN Performed at [facility]    Report Status 07/21/2015 FINAL  Final    Studies/Results: US Renal  07/22/2015  CLINICAL DATA:  Multiple renal cysts. Recent renal cyst drainage. Evaluate for hydronephrosis. Increasing pain overnight. EXAM: RENAL / URINARY TRACT ULTRASOUND COMPLETE COMPARISON:  Renal ultrasound dated 07/21/2015. FINDINGS: Right Kidney: Length: 15.3 cm. There is moderate right-sided hydronephrosis. Multiple cysts are seen within the right kidney, largest being a complex cyst with thick internal septations located exophytic to the lower pole measuring 12.4 x 11.5 x 12 cm. No renal stone identified. Left Kidney: Length: 16.6 cm. No left-sided hydronephrosis seen. There are multiple cysts within the left kidney, largest of which is a simple - appearing cyst within the midpole region measuring 6.5 x 5.3 x 5.9 cm. No renal stone identified. Bladder: Bladder is unremarkable. Both distal ureters shown to be patent at the level of the bladder (bilateral ureteral jets visualized). IMPRESSION: 1. Moderate right-sided hydronephrosis, difficult to definitively characterize due to the numerous right renal cysts. 2. Multiple renal cysts bilaterally. Largest right renal cyst is complex with thick internal septations located exophytic to the lower pole measuring 12.4 x 11.5 x 12 cm. It is unclear if this is the same renal cyst that was aspirated yesterday with interval reaccumulation. At minimum, would consider follow-up renal ultrasound or  CT at some point to ensure benignity of this dominant complex cyst. Electronically Signed   By: Franki Cabot M.D.   On: 07/22/2015 07:15   US Aspiration  07/21/2015  CLINICAL DATA:  Large right renal cyst EXAM: US ASPIRATION FLUOROSCOPY TIME:  None MEDICATIONS AND MEDICAL HISTORY: Versed Three mg, Fentanyl 100 mcg. Additional Medications: None. ANESTHESIA/SEDATION: Moderate sedation time: 13 minutes CONTRAST:  None PROCEDURE: The procedure, risks,  benefits, and alternatives were explained to the patient. Questions regarding the procedure were encouraged and answered. The patient understands and consents to the procedure. The right lower quadrant was prepped with Betadine in a sterile fashion, and a sterile drape was applied covering the operative field. A sterile gown and sterile gloves were used for the procedure. Under sonographic guidance, a Safe-T-Centesis needle was advanced into the large right renal cyst. 900 cc dark clear fluid was aspirated without complication. FINDINGS: Images document catheter placement within the right renal cyst. Post aspiration images demonstrate resolution of the fluid. COMPLICATIONS: None IMPRESSION: Successful ultrasound-guided aspiration of a large right renal cyst yielding 900 cc. Electronically Signed   By: Marybelle Killings M.D.   On: 07/21/2015 14:02   Ct Image Guided Drainage Percut Cath  Peritoneal Retroperit  07/22/2015  CLINICAL DATA:  Right renal cysts recurrence EXAM: CT CORE BIOPSY RENAL FLUOROSCOPY TIME:  None MEDICATIONS AND MEDICAL HISTORY: Versed 1 mg, Fentanyl 50 mcg. Additional Medications: None. ANESTHESIA/SEDATION: Moderate sedation time: 15 minutes CONTRAST:  None PROCEDURE: The procedure, risks, benefits, and alternatives were explained to the patient. Questions regarding the procedure were encouraged and answered. The patient understands and consents to the procedure. The right lower quadrant was prepped with Betadine in a sterile fashion, and a sterile drape was applied covering the operative field. A sterile gown and sterile gloves were used for the procedure. Under CT guidance, an 18 gauge needle was inserted into the right lower quadrant renal cyst and removed over an Amplatz. A 10 French dilator followed by a 10 [place] drain were inserted. It was looped and string fixed then sewn to the skin. Clear yellow fluid was aspirated. FINDINGS: Scout images demonstrate that the large right renal cyst has  completely recurred. Images document 10 French drain placement into a right renal cyst. COMPLICATIONS: None IMPRESSION: Successful right renal cyst drainage. Electronically Signed   By: Marybelle Killings M.D.   On: 07/22/2015 12:51    Assessment/Plan:   Status post drainage of rapidly recurrent right lower pole renal cyst.  We will send the drainage offer creatinine-if it is creatinine, she will eventually need stent placement and possible Foley catheter placement to allow low-pressure drainage of that right collecting system. If it is not urine, we will leave the drain in for eventual sclerosis of the cyst or laparoscopic unroofing.  I have ordered a contrasted CT scan today (without oral contrast) to check for upper tract collecting system appearance/drainage. As far as I'm concerned, she can go home following the CT scan with the drain in place. We will arrange follow-up after that.   LOS: 1 day   Franchot Gallo M 07/23/2015, 8:56 AM

## 2015-07-23 NOTE — Progress Notes (Signed)
Referring Physician(s): Dahlstedt  Chief Complaint:  Recurrent renal cyst S/P CT guided drain placement by Dr. Barbie Banner 07/22/2015  Subjective:   Jennifer George is doing well this morning. She c/o a little pain "on the inside" and states she thinks "it's where the drain enters the kidney".    Overall, she feels better and has much less pain.  Allergies: Review of patient's allergies indicates no known allergies.  Medications: Prior to Admission medications   Medication Sig Start Date End Date Taking? Authorizing Provider  aspirin 81 MG tablet Take 81 mg by mouth daily.   Yes Historical Provider, MD  atorvastatin (LIPITOR) 20 MG tablet Take 20 mg by mouth daily. 06/17/15  Yes Historical Provider, MD  bifidobacterium infantis (ALIGN) capsule Take 1 capsule by mouth daily.   Yes Historical Provider, MD  cholecalciferol (VITAMIN D) 1000 UNITS tablet Take 1,000 Units by mouth daily.   Yes Historical Provider, MD  estradiol (VIVELLE-DOT) 0.05 MG/24HR patch Place 1 patch onto the skin 2 (two) times a week. Tues and Saturday.   Yes Historical Provider, MD  fexofenadine (ALLEGRA) 180 MG tablet Take 180 mg by mouth daily.   Yes Historical Provider, MD  levothyroxine (SYNTHROID, LEVOTHROID) 88 MCG tablet Take 88 mcg by mouth daily.   Yes Historical Provider, MD  progesterone (PROMETRIUM) 200 MG capsule TAKE 1 CAPSULE(S) EVERY DAY BY ORAL ROUTE FOR 14 NIGHTS EVERY OTHER MONTH 06/20/15  Yes Historical Provider, MD  pyridOXINE (VITAMIN B-6) 100 MG tablet Take 100 mg by mouth daily.   Yes Historical Provider, MD  traZODone (DESYREL) 100 MG tablet Take 200 mg by mouth at bedtime. 07/06/15  Yes Historical Provider, MD     Vital Signs: BP 131/73 mmHg  Pulse 65  Temp(Src) 98 F (36.7 C) (Oral)  Resp 18  Ht 5\' 8"  (1.727 m)  Wt 144 lb 12.8 oz (65.681 kg)  BMI 22.02 kg/m2  SpO2 96%  Physical Exam Awake and alert Heart RRR Lungs clear Abdomen nontender Drain in place, looks good. Output ~1865  mls clear yellow   Imaging: Ct Abdomen Pelvis Wo Contrast  07/20/2015  CLINICAL DATA:  Right lower quadrant pain EXAM: CT ABDOMEN AND PELVIS WITHOUT CONTRAST TECHNIQUE: Multidetector CT imaging of the abdomen and pelvis was performed following the standard protocol without IV contrast. COMPARISON:  None. FINDINGS: Innumerable cysts are seen in both kidneys. There are some rim calcifications associated with left renal cysts. The largest right renal cyst is 13 cm. Previously this was reported at 11.5 cm, therefore it has enlarged. Moderate right hydronephrosis is present. There is right-sided perinephric fluid density. No obvious ureteral calculus can be identified. There is an apparent rind of soft tissue surrounding the aorta. There is no soft tissue posterior to the aorta. This does extend to the iliac vasculature region. Appendix is within normal limits. 5 mm sub solid left lower lobe pulmonary nodule posteriorly is partially imaged on image 1. Liver, gallbladder, spleen, pancreas, adrenal glands are within normal limits. Diverticulosis throughout much of the colon. Sigmoid is decompressed Bladder, uterus, and adnexa are unremarkable. No vertebral compression deformity. IMPRESSION: Right hydronephrosis and perinephric stranding have developed. These are secondary findings of ureteral obstruction. No ureteral calculus can be seen. There is an on enlarging right renal cyst which may be compressing the right ureter. There is also a soft tissue rind surrounding the aorta which may represent retroperitoneal fibrosis or possibly lymphoma. CT abdomen and pelvis with contrast is recommended to further characterize, and is  recommended as the first next step. The soft tissue rind will be better evaluated by this. Drainage of the right renal cyst may improve drainage of the right kidney. Retrograde pyelogram can be performed to evaluate for obstructing lesion in the right ureter. 5 mm sub solid nodule in the left lower  lobe. Initial follow-up by chest CT without contrast is recommended in 3 months to confirm persistence. This recommendation follows the consensus statement: Recommendations for the Management of Subsolid Pulmonary Nodules Detected at CT: A Statement from the [employer] as published in Radiology 2013; 266:304-317. Electronically Signed   By: Marybelle Killings M.D.   On: 07/20/2015 08:34   US Renal  07/22/2015  CLINICAL DATA:  Multiple renal cysts. Recent renal cyst drainage. Evaluate for hydronephrosis. Increasing pain overnight. EXAM: RENAL / URINARY TRACT ULTRASOUND COMPLETE COMPARISON:  Renal ultrasound dated 07/21/2015. FINDINGS: Right Kidney: Length: 15.3 cm. There is moderate right-sided hydronephrosis. Multiple cysts are seen within the right kidney, largest being a complex cyst with thick internal septations located exophytic to the lower pole measuring 12.4 x 11.5 x 12 cm. No renal stone identified. Left Kidney: Length: 16.6 cm. No left-sided hydronephrosis seen. There are multiple cysts within the left kidney, largest of which is a simple - appearing cyst within the midpole region measuring 6.5 x 5.3 x 5.9 cm. No renal stone identified. Bladder: Bladder is unremarkable. Both distal ureters shown to be patent at the level of the bladder (bilateral ureteral jets visualized). IMPRESSION: 1. Moderate right-sided hydronephrosis, difficult to definitively characterize due to the numerous right renal cysts. 2. Multiple renal cysts bilaterally. Largest right renal cyst is complex with thick internal septations located exophytic to the lower pole measuring 12.4 x 11.5 x 12 cm. It is unclear if this is the same renal cyst that was aspirated yesterday with interval reaccumulation. At minimum, would consider follow-up renal ultrasound or CT at some point to ensure benignity of this dominant complex cyst. Electronically Signed   By: Franki Cabot M.D.   On: 07/22/2015 07:15   US Aspiration  07/21/2015  CLINICAL  DATA:  Large right renal cyst EXAM: US ASPIRATION FLUOROSCOPY TIME:  None MEDICATIONS AND MEDICAL HISTORY: Versed Three mg, Fentanyl 100 mcg. Additional Medications: None. ANESTHESIA/SEDATION: Moderate sedation time: 13 minutes CONTRAST:  None PROCEDURE: The procedure, risks, benefits, and alternatives were explained to the patient. Questions regarding the procedure were encouraged and answered. The patient understands and consents to the procedure. The right lower quadrant was prepped with Betadine in a sterile fashion, and a sterile drape was applied covering the operative field. A sterile gown and sterile gloves were used for the procedure. Under sonographic guidance, a Safe-T-Centesis needle was advanced into the large right renal cyst. 900 cc dark clear fluid was aspirated without complication. FINDINGS: Images document catheter placement within the right renal cyst. Post aspiration images demonstrate resolution of the fluid. COMPLICATIONS: None IMPRESSION: Successful ultrasound-guided aspiration of a large right renal cyst yielding 900 cc. Electronically Signed   By: Marybelle Killings M.D.   On: 07/21/2015 14:02   Ct Image Guided Drainage Percut Cath  Peritoneal Retroperit  07/22/2015  CLINICAL DATA:  Right renal cysts recurrence EXAM: CT CORE BIOPSY RENAL FLUOROSCOPY TIME:  None MEDICATIONS AND MEDICAL HISTORY: Versed 1 mg, Fentanyl 50 mcg. Additional Medications: None. ANESTHESIA/SEDATION: Moderate sedation time: 15 minutes CONTRAST:  None PROCEDURE: The procedure, risks, benefits, and alternatives were explained to the patient. Questions regarding the procedure were encouraged and answered. The patient understands  and consents to the procedure. The right lower quadrant was prepped with Betadine in a sterile fashion, and a sterile drape was applied covering the operative field. A sterile gown and sterile gloves were used for the procedure. Under CT guidance, an 18 gauge needle was inserted into the right lower  quadrant renal cyst and removed over an Amplatz. A 10 French dilator followed by a 10 [place] drain were inserted. It was looped and string fixed then sewn to the skin. Clear yellow fluid was aspirated. FINDINGS: Scout images demonstrate that the large right renal cyst has completely recurred. Images document 10 French drain placement into a right renal cyst. COMPLICATIONS: None IMPRESSION: Successful right renal cyst drainage. Electronically Signed   By: Marybelle Killings M.D.   On: 07/22/2015 12:51    Labs:  CBC:  Recent Labs  07/20/15 1607 07/21/15 0415 07/22/15 0522 07/23/15 0520  WBC 11.7* 11.0* 13.4* 8.7  HGB 13.3 12.8 13.1 11.9*  HCT 38.5 37.9 40.0 36.9  PLT 414* 415* 467* 462*    COAGS:  Recent Labs  07/21/15 0415  INR 1.08    BMP:  Recent Labs  07/20/15 1607 07/21/15 0415 07/22/15 0522 07/23/15 0520  NA 137 140 141 140  K 4.1 4.5 4.1 4.6  CL 105 111 112* 112*  CO2 25 23 21* 23  GLUCOSE 124* 102* 120* 95  BUN 26* 32* 27* 17  CALCIUM 9.6 9.5 9.7 8.9  CREATININE 2.38* 2.27* 1.54* 1.10*  GFRNONAA 21* 22* 35* 53*  GFRAA 24* 25* 41* >60    LIVER FUNCTION TESTS:  Recent Labs  07/20/15 1630 07/21/15 0415 07/22/15 0522  BILITOT 0.5 0.5 0.5  AST 21 17 27   ALT 25 28 46  ALKPHOS 85 80 82  PROT 6.9 6.4* 6.9  ALBUMIN 3.2* 2.9* 3.2*    Assessment and Plan:  Polycystic Kidney Disease S/P CT guided drain placement by Dr. Barbie Banner 07/22/2015 Care per Urology   Signed: Murrell Redden PA-C 07/23/2015, 8:42 AM   I spent a total of 15 Minutes at the the patient's bedside AND on the patient's hospital floor or unit, greater than 50% of which was counseling/coordinating care for f/u after drain placement

## 2015-07-23 NOTE — Discharge Summary (Signed)
Physician Discharge Summary  [name] [id number] DOB: [birth year] DOA: 07/22/2015  PCP: Geoffery Lyons, MD  Admit date: 07/22/2015 Discharge date: 07/23/2015  Time spent: 45 minutes  Recommendations for Outpatient Follow-up:  Patient will be discharged to home.  Patient will need to follow up with primary care provider within one week of discharge, discuss hypothyroidism.  Follow up with Dr. Diona Fanti, urology.  Patient will also need repeat CT scan within 3 months for lung nodule. Patient should continue medications as prescribed.  Patient should follow a regular diet.  Advised to stop smoking.  Discharge Diagnoses:  Recurrent right hydronephrosis/multicystic kidney disease Intractable pain Leukocytosis Acute kidney injury Tobacco abuse Questionable retroperitoneal fibrosis 5 mm solid a left lung nodule Hypothyroidism Hyperlipidemia Abdominal cramping and gas  Discharge Condition: Stable  Diet recommendation: Regular  Filed Weights   07/22/15 0437 07/23/15 0615  Weight: 67.132 kg (148 lb) 65.681 kg (144 lb 12.8 oz)    History of present illness:  On 07/22/2015 Jennifer George is a 64 y.o. female with a history of multicystic kidney disease, hypothyroidism, tobacco abuse, that presented to the emergency department with complaints of abdominal pain. Patient was recently discharged 07/21/2015. She was admitted with acute kidney injury symptomatic renal cyst which was drained, 900 mL by interventional radiology. Patient had improved and was discharged, with outpatient urology follow-up. Upon arrival to home, patient started to have more pain in her abdomen with swelling noted on the right side. Patient admits that in the past she has had several drainages of the cyst and usually the fluid does not return quickly. In the emergency department, ultrasound was performed showing recurrence of the right lower pole cyst. She currently denies any chest pain or shortness of  breath, nausea or vomiting, dizziness or headache. She does complain of some abdominal cramping and change in her bowel pattern. Although she states that she has had poor appetite recently. [facility] was called for admission.  Hospital Course:  Recurrent right hydronephrosis/multicystic kidney disease -Urology consulted and appreciated -Interventional radiology consultation appreciated- s/p CT guided drain placement -Urology ordered CT with IV contrast to check for upper tract collecting system appearance and drainage, as well as culture of drain fluid.  -patient will need outpatient follow up with urology.  If fluid is creatinine, patient will need stent placement and possible foley catheter.  If the fluid is urine, patient will need sclerosis or laproscopic unroofing.  -Patient will be discharged with pain medications and antiemetics.  Intractable pain -Likely secondary to the above -Continue pain control  Leukocytosis -Secondary to the above -Resolved  Acute kidney injury -Creatinine does appear to be much improved from discharge on 12/2, currently 1.1  Tobacco abuse -Discussed smoking cessation  ? Retroperitoneal fibrosis -Will need outpatient follow-up with urology  5 mm solid left lung nodule -Repeat CT chest without contrast in 3 months  Hypothyroidism -Continue Synthroid, patient will need follow-up with her primary care physician regarding dosing  Hyperlipidemia -Continue statin  Abdominal cramping and gas -Continue simethicone PRN -Possible due to IBS?  Procedures: CT guided drain placement by IR on 07/22/2015  Consultations: Urology Interventional radiology  Discharge Exam: Filed Vitals:   07/22/15 2140 07/23/15 0615  BP: 132/61 131/73  Pulse: 77 65  Temp: 98.2 F (36.8 C) 98 F (36.7 C)  Resp: 18 18     General: Well developed, well nourished, NAD, appears stated age  HEENT: NCAT, mucous membranes moist.  Cardiovascular: S1 S2 auscultated, no rubs,  murmurs or gallops.  Regular rate and rhythm.  Respiratory: Clear to auscultation bilaterally with equal chest rise  Abdomen: Soft, nontender, nondistended, + bowel sounds, +drain with yellow fluid  Extremities: warm dry without cyanosis clubbing or edema  Neuro: AAOx3, nonfocal  Psych: Liable this morning, otherwise appropriate  Discharge Instructions      Discharge Instructions    Discharge instructions    Complete by:  As directed   Patient will be discharged to home.  Patient will need to follow up with primary care provider within one week of discharge, discuss hypothyroidism.  Follow up with Dr. Diona Fanti, urology.  Patient will also need repeat CT scan within 3 months for lung nodule. Patient should continue medications as prescribed.  Patient should follow a regular diet.  Advised to stop smoking.            Medication List    TAKE these medications        aspirin 81 MG tablet  Take 81 mg by mouth daily.     atorvastatin 20 MG tablet  Commonly known as:  LIPITOR  Take 20 mg by mouth daily.     bifidobacterium infantis capsule  Take 1 capsule by mouth daily.     cholecalciferol 1000 UNITS tablet  Commonly known as:  VITAMIN D  Take 1,000 Units by mouth daily.     estradiol 0.05 MG/24HR patch  Commonly known as:  VIVELLE-DOT  Place 1 patch onto the skin 2 (two) times a week. Tues and Saturday.     fexofenadine 180 MG tablet  Commonly known as:  ALLEGRA  Take 180 mg by mouth daily.     levothyroxine 88 MCG tablet  Commonly known as:  SYNTHROID, LEVOTHROID  Take 88 mcg by mouth daily.     oxyCODONE 5 MG immediate release tablet  Commonly known as:  Oxy IR/ROXICODONE  Take 1 tablet (5 mg total) by mouth every 4 (four) hours as needed for moderate pain or breakthrough pain.     progesterone 200 MG capsule  Commonly known as:  PROMETRIUM  TAKE 1 CAPSULE(S) EVERY DAY BY ORAL ROUTE FOR 14 NIGHTS EVERY OTHER MONTH     pyridOXINE 100 MG tablet  Commonly  known as:  VITAMIN B-6  Take 100 mg by mouth daily.     simethicone 80 MG chewable tablet  Commonly known as:  MYLICON  Chew 1 tablet (80 mg total) by mouth every 6 (six) hours as needed for flatulence.     traZODone 100 MG tablet  Commonly known as:  DESYREL  Take 200 mg by mouth at bedtime.       No Known Allergies Follow-up Information    Follow up with ARONSON,RICHARD A, MD. Schedule an appointment as soon as possible for a visit in 1 week.   Specialty:  Internal Medicine   Why:  Hospital follow-up, discuss hypothyroidism   Contact information:   [address] [place] [address] [phone]       Follow up with Jorja Loa, MD. Schedule an appointment as soon as possible for a visit in 1 week.   Specialty:  Urology   Why:  Hospital follow-up   Contact information:   [place] [address] [phone]        The results of significant diagnostics from this hospitalization (including imaging, microbiology, ancillary and laboratory) are listed below for reference.    Significant Diagnostic Studies: Ct Abdomen Pelvis W Wo Contrast  07/23/2015  CLINICAL DATA:  Follow  up right renal cyst drainage yesterday. Patient reports some discomfort at the catheter site. EXAM: CT ABDOMEN AND PELVIS WITHOUT AND WITH CONTRAST TECHNIQUE: Multidetector CT imaging of the abdomen and pelvis was performed following the standard protocol before and following the bolus administration of intravenous contrast. CONTRAST:  67mL OMNIPAQUE IOHEXOL 300 MG/ML  SOLN COMPARISON:  CT 07/19/2015 and ultrasound 07/22/2015. Images during cyst drainage yesterday. FINDINGS: Lower chest: Small left lower lobe nodule on image number 3 is unchanged. There is new linear atelectasis at both lung bases. No significant pleural or pericardial effusion. Hepatobiliary: The liver is normal in density without focal abnormality. No evidence of gallstones or gallbladder wall thickening. Minimal  extrahepatic biliary prominence is unchanged. Pancreas: Unremarkable. No pancreatic ductal dilatation or surrounding inflammatory changes. Spleen: Normal in size without focal abnormality. Adrenals/Urinary Tract: Both adrenal glands appear normal. Interval percutaneous drainage of the dominant cyst involving the lower pole of the right kidney. Percutaneous pigtail drain remains in place. There is a possible small residual septated component of the cyst anterior to the drain, measuring 3.8 x 3.1 cm on image 47 of series 5. This demonstrates no communication with the collecting system on the delayed post-contrast images. The perinephric fluid and soft tissue stranding noted on the prior CT have improved. There is persistent distortion of the right lower pole calices, but no hydronephrosis or significant delay in contrast excretion. Numerous bilateral renal cysts are again noted bilaterally. These appear simple on the right. There are few thin septal calcifications associated with some of the left-sided lesions, although noted no solid or enhancing components identified. The bladder appears unremarkable. Stomach/Bowel: No evidence of bowel wall thickening, distention or surrounding inflammatory change. There are diverticular changes throughout the distal colon. Suspected previous appendectomy. Vascular/Lymphatic: As demonstrated on recent CT, there is a rind of retroperitoneal soft tissue encasing the distal abdominal aorta, inferior to the renal vessels and extending just pass the iliac bifurcation. This measures up to 1.5 cm in thickness and demonstrates homogeneous enhancement following contrast. There is no associated calcification, necrosis or retro aortic extension. The ureters are in close proximity to this process, although do not appear obstructed based on the delayed post-contrast images. There are no discretely enlarged retroperitoneal or mesenteric lymph nodes. There is moderate aortoiliac atherosclerosis  without evidence of aneurysm or large vessel occlusion. No significant venous abnormalities. Reproductive: Unremarkable. Other: No ascites or peritoneal nodularity. As above, mesenteric edema and inflammation have improved from the prior CT. Musculoskeletal: No acute or significant osseous findings. Mild lumbar spine degenerative changes associated with a convex left scoliosis. IMPRESSION: 1. Interval percutaneous drainage of dominant cyst involving the lower pole of the right kidney without demonstrated complication. There may be a small residual cyst. The perinephric soft tissue stranding and fluid present previously have improved. 2. Multiple simple and mildly complex renal cysts are present bilaterally. No suspicious renal findings. 3. Enhancing rind of retroperitoneal soft tissue surrounding the distal aorta and proximal iliac arteries, favored to reflect retroperitoneal fibrosis. There is no other adenopathy or retro aortic extension to suggest lymphoma. This process does place the patient at risk for ureteral obstruction, although that is not demonstrated at this time. Electronically Signed   By: Richardean Sale M.D.   On: 07/23/2015 11:55   Ct Abdomen Pelvis Wo Contrast  07/20/2015  CLINICAL DATA:  Right lower quadrant pain EXAM: CT ABDOMEN AND PELVIS WITHOUT CONTRAST TECHNIQUE: Multidetector CT imaging of the abdomen and pelvis was performed following the standard protocol without IV  contrast. COMPARISON:  None. FINDINGS: Innumerable cysts are seen in both kidneys. There are some rim calcifications associated with left renal cysts. The largest right renal cyst is 13 cm. Previously this was reported at 11.5 cm, therefore it has enlarged. Moderate right hydronephrosis is present. There is right-sided perinephric fluid density. No obvious ureteral calculus can be identified. There is an apparent rind of soft tissue surrounding the aorta. There is no soft tissue posterior to the aorta. This does extend to  the iliac vasculature region. Appendix is within normal limits. 5 mm sub solid left lower lobe pulmonary nodule posteriorly is partially imaged on image 1. Liver, gallbladder, spleen, pancreas, adrenal glands are within normal limits. Diverticulosis throughout much of the colon. Sigmoid is decompressed Bladder, uterus, and adnexa are unremarkable. No vertebral compression deformity. IMPRESSION: Right hydronephrosis and perinephric stranding have developed. These are secondary findings of ureteral obstruction. No ureteral calculus can be seen. There is an on enlarging right renal cyst which may be compressing the right ureter. There is also a soft tissue rind surrounding the aorta which may represent retroperitoneal fibrosis or possibly lymphoma. CT abdomen and pelvis with contrast is recommended to further characterize, and is recommended as the first next step. The soft tissue rind will be better evaluated by this. Drainage of the right renal cyst may improve drainage of the right kidney. Retrograde pyelogram can be performed to evaluate for obstructing lesion in the right ureter. 5 mm sub solid nodule in the left lower lobe. Initial follow-up by chest CT without contrast is recommended in 3 months to confirm persistence. This recommendation follows the consensus statement: Recommendations for the Management of Subsolid Pulmonary Nodules Detected at CT: A Statement from the [employer] as published in Radiology 2013; 266:304-317. Electronically Signed   By: Marybelle Killings M.D.   On: 07/20/2015 08:34   US Renal  07/22/2015  CLINICAL DATA:  Multiple renal cysts. Recent renal cyst drainage. Evaluate for hydronephrosis. Increasing pain overnight. EXAM: RENAL / URINARY TRACT ULTRASOUND COMPLETE COMPARISON:  Renal ultrasound dated 07/21/2015. FINDINGS: Right Kidney: Length: 15.3 cm. There is moderate right-sided hydronephrosis. Multiple cysts are seen within the right kidney, largest being a complex cyst with  thick internal septations located exophytic to the lower pole measuring 12.4 x 11.5 x 12 cm. No renal stone identified. Left Kidney: Length: 16.6 cm. No left-sided hydronephrosis seen. There are multiple cysts within the left kidney, largest of which is a simple - appearing cyst within the midpole region measuring 6.5 x 5.3 x 5.9 cm. No renal stone identified. Bladder: Bladder is unremarkable. Both distal ureters shown to be patent at the level of the bladder (bilateral ureteral jets visualized). IMPRESSION: 1. Moderate right-sided hydronephrosis, difficult to definitively characterize due to the numerous right renal cysts. 2. Multiple renal cysts bilaterally. Largest right renal cyst is complex with thick internal septations located exophytic to the lower pole measuring 12.4 x 11.5 x 12 cm. It is unclear if this is the same renal cyst that was aspirated yesterday with interval reaccumulation. At minimum, would consider follow-up renal ultrasound or CT at some point to ensure benignity of this dominant complex cyst. Electronically Signed   By: Franki Cabot M.D.   On: 07/22/2015 07:15   US Aspiration  07/21/2015  CLINICAL DATA:  Large right renal cyst EXAM: US ASPIRATION FLUOROSCOPY TIME:  None MEDICATIONS AND MEDICAL HISTORY: Versed Three mg, Fentanyl 100 mcg. Additional Medications: None. ANESTHESIA/SEDATION: Moderate sedation time: 13 minutes CONTRAST:  None PROCEDURE: The  procedure, risks, benefits, and alternatives were explained to the patient. Questions regarding the procedure were encouraged and answered. The patient understands and consents to the procedure. The right lower quadrant was prepped with Betadine in a sterile fashion, and a sterile drape was applied covering the operative field. A sterile gown and sterile gloves were used for the procedure. Under sonographic guidance, a Safe-T-Centesis needle was advanced into the large right renal cyst. 900 cc dark clear fluid was aspirated without  complication. FINDINGS: Images document catheter placement within the right renal cyst. Post aspiration images demonstrate resolution of the fluid. COMPLICATIONS: None IMPRESSION: Successful ultrasound-guided aspiration of a large right renal cyst yielding 900 cc. Electronically Signed   By: Marybelle Killings M.D.   On: 07/21/2015 14:02   Ct Image Guided Drainage Percut Cath  Peritoneal Retroperit  07/22/2015  CLINICAL DATA:  Right renal cysts recurrence EXAM: CT CORE BIOPSY RENAL FLUOROSCOPY TIME:  None MEDICATIONS AND MEDICAL HISTORY: Versed 1 mg, Fentanyl 50 mcg. Additional Medications: None. ANESTHESIA/SEDATION: Moderate sedation time: 15 minutes CONTRAST:  None PROCEDURE: The procedure, risks, benefits, and alternatives were explained to the patient. Questions regarding the procedure were encouraged and answered. The patient understands and consents to the procedure. The right lower quadrant was prepped with Betadine in a sterile fashion, and a sterile drape was applied covering the operative field. A sterile gown and sterile gloves were used for the procedure. Under CT guidance, an 18 gauge needle was inserted into the right lower quadrant renal cyst and removed over an Amplatz. A 10 French dilator followed by a 10 [place] drain were inserted. It was looped and string fixed then sewn to the skin. Clear yellow fluid was aspirated. FINDINGS: Scout images demonstrate that the large right renal cyst has completely recurred. Images document 10 French drain placement into a right renal cyst. COMPLICATIONS: None IMPRESSION: Successful right renal cyst drainage. Electronically Signed   By: Marybelle Killings M.D.   On: 07/22/2015 12:51    Microbiology: Recent Results (from the past 240 hour(s))  Culture, body fluid-bottle     Status: None (Preliminary result)   Collection Time: 07/21/15 12:14 PM  Result Value Ref Range Status   Specimen Description FLUID RIGHT RENAL CYSTS  Final   Special Requests NONE  Final    Culture   Final    NO GROWTH 1 DAY Performed at [facility]    Report Status PENDING  Incomplete  Gram stain     Status: None   Collection Time: 07/21/15 12:14 PM  Result Value Ref Range Status   Specimen Description FLUID RIGHT RENAL CYSTS  Final   Special Requests NONE  Final   Gram Stain   Final    RARE WBC PRESENT,BOTH PMN AND MONONUCLEAR NO ORGANISMS SEEN Performed at [facility]    Report Status 07/21/2015 FINAL  Final     Labs: Basic Metabolic Panel:  Recent Labs Lab 07/20/15 1607 07/21/15 0415 07/22/15 0522 07/23/15 0520  NA 137 140 141 140  K 4.1 4.5 4.1 4.6  CL 105 111 112* 112*  CO2 25 23 21* 23  GLUCOSE 124* 102* 120* 95  BUN 26* 32* 27* 17  CREATININE 2.38* 2.27* 1.54* 1.10*  CALCIUM 9.6 9.5 9.7 8.9   Liver Function Tests:  Recent Labs Lab 07/20/15 1630 07/21/15 0415 07/22/15 0522  AST 21 17 27   ALT 25 28 46  ALKPHOS 85 80 82  BILITOT 0.5 0.5 0.5  PROT 6.9 6.4* 6.9  ALBUMIN  3.2* 2.9* 3.2*    Recent Labs Lab 07/22/15 0522  LIPASE 26   No results for input(s): AMMONIA in the last 168 hours. CBC:  Recent Labs Lab 07/20/15 1607 07/21/15 0415 07/22/15 0522 07/23/15 0520  WBC 11.7* 11.0* 13.4* 8.7  NEUTROABS 8.4*  --  10.3*  --   HGB 13.3 12.8 13.1 11.9*  HCT 38.5 37.9 40.0 36.9  MCV 87.9 88.3 89.7 91.3  PLT 414* 415* 467* 462*   Cardiac Enzymes: No results for input(s): CKTOTAL, CKMB, CKMBINDEX, TROPONINI in the last 168 hours. BNP: BNP (last 3 results) No results for input(s): BNP in the last 8760 hours.  ProBNP (last 3 results) No results for input(s): PROBNP in the last 8760 hours.  CBG: No results for input(s): GLUCAP in the last 168 hours.   SignedCristal Ford  [facility] 07/23/2015, 12:09 PM

## 2015-07-23 NOTE — Discharge Instructions (Signed)
Hydronephrosis  Hydronephrosis is the enlargement of a kidney due to a blockage that stops urine from flowing out of the body.  CAUSES  Common causes of this condition include:  · A birth (congenital) defect of the kidney.  · A congenital defect of the tube through which urine travels (ureter).  · Kidney stones.  · An enlarged prostate gland.  · A tumor.  · Cancer of the prostate, bladder, uterus, ovary, or colon.  · A blood clot.  SYMPTOMS  Symptoms of this condition include:  · Pain or discomfort in your side (flank).  · Swelling of the abdomen.  · Pain in the abdomen.  · Nausea and vomiting.  · Fever.  · Pain while passing urine.  · Feeling of urgency to urinate.  · Frequent urination.  · Infection of the urinary tract.  In some cases, there are no symptoms.  DIAGNOSIS  This condition may be diagnosed with:  · A medical history.  · A physical exam.  · Blood and urine tests to check kidney function.  · Imaging tests, such as an X-ray, ultrasound, CT scan, or MRI.  · A test in which a rigid or flexible telescope (cystoscope) is used to view the site of the blockage.  TREATMENT  Treatment for this condition depends on where the blockage is located, how long it has been there, and what caused it. The goal of treatment is to remove the blockage. Treatment options include:  · A procedure to put in a soft tube to help drain urine.  · Antibiotic medicines to treat or prevent infection.  · Shock-wave therapy (lithotripsy) to help eliminate kidney stones.  HOME CARE INSTRUCTIONS  · Get lots of rest.  · Drink enough fluid to keep your urine clear or pale yellow.  · If you have a drain in, follow your health care provider's instructions about how to care for it.  · Take medicines only as directed by your health care provider.  · If you were prescribed an antibiotic medicine, finish all of it even if you start to feel better.  · Keep all follow-up visits as directed by your health care provider. This is important.  SEEK  MEDICAL CARE IF:  · You continue to have symptoms after treatment.  · You develop new symptoms.  · You have a problem with a drainage device.  · Your urine becomes cloudy or bloody.  · You have a fever.  SEEK IMMEDIATE MEDICAL CARE IF:  · You have severe flank or abdominal pain.  · You develop vomiting and are unable to keep fluids down.     This information is not intended to replace advice given to you by your health care provider. Make sure you discuss any questions you have with your health care provider.     Document Released: 06/02/2007 Document Revised: 12/20/2014 Document Reviewed: 08/01/2014  Elsevier Interactive Patient Education ©2016 [employer]

## 2015-07-23 NOTE — Progress Notes (Signed)
Went over all d/c instructions with patient and husband.  Both verbalized understanding.  New dressing to abdominal catheter.  Patient verbalized understanding of catheter dressing.   Left hospital with all belongings, hard script via w/c and personal vehicle.   Virginia Rochester, RN

## 2015-07-25 ENCOUNTER — Encounter (HOSPITAL_BASED_OUTPATIENT_CLINIC_OR_DEPARTMENT_OTHER): Payer: Self-pay | Admitting: *Deleted

## 2015-07-25 ENCOUNTER — Other Ambulatory Visit: Payer: Self-pay | Admitting: Urology

## 2015-07-25 NOTE — Progress Notes (Signed)
NPO AFTER MN WITH EXCEPTION CLEAR LIQUIDS UNTIL 0700 (NO CREAM/ MILK PRODUCTS).  ARRIVE AT 1145.  NEEDS EKG.  CURRENT LAB RESULTS IN CHART AND EPIC .  WILL TAKE SYNTHROID, LIPITOR, AND ALLEGRA AM DOS W/ SIPS OF WATER.

## 2015-07-26 LAB — CULTURE, BODY FLUID W GRAM STAIN -BOTTLE: Culture: NO GROWTH

## 2015-07-28 ENCOUNTER — Encounter (HOSPITAL_BASED_OUTPATIENT_CLINIC_OR_DEPARTMENT_OTHER): Payer: Self-pay | Admitting: *Deleted

## 2015-07-28 ENCOUNTER — Ambulatory Visit (HOSPITAL_BASED_OUTPATIENT_CLINIC_OR_DEPARTMENT_OTHER): Payer: BLUE CROSS/BLUE SHIELD | Admitting: Anesthesiology

## 2015-07-28 ENCOUNTER — Ambulatory Visit (HOSPITAL_BASED_OUTPATIENT_CLINIC_OR_DEPARTMENT_OTHER)
Admission: RE | Admit: 2015-07-28 | Discharge: 2015-07-28 | Disposition: A | Discharge status: Home / Self Care | Payer: BLUE CROSS/BLUE SHIELD | Source: Ambulatory Visit | Attending: Urology | Admitting: Urology

## 2015-07-28 ENCOUNTER — Encounter (HOSPITAL_BASED_OUTPATIENT_CLINIC_OR_DEPARTMENT_OTHER)
Admission: RE | Disposition: A | Discharge status: Home / Self Care | Payer: Self-pay | Source: Ambulatory Visit | Attending: Urology

## 2015-07-28 ENCOUNTER — Other Ambulatory Visit: Payer: Self-pay

## 2015-07-28 DIAGNOSIS — K219 Gastro-esophageal reflux disease without esophagitis: Secondary | ICD-10-CM | POA: Diagnosis not present

## 2015-07-28 DIAGNOSIS — Z79899 Other long term (current) drug therapy: Secondary | ICD-10-CM | POA: Diagnosis not present

## 2015-07-28 DIAGNOSIS — Z7982 Long term (current) use of aspirin: Secondary | ICD-10-CM | POA: Diagnosis not present

## 2015-07-28 DIAGNOSIS — R911 Solitary pulmonary nodule: Secondary | ICD-10-CM | POA: Diagnosis not present

## 2015-07-28 DIAGNOSIS — N281 Cyst of kidney, acquired: Secondary | ICD-10-CM | POA: Diagnosis present

## 2015-07-28 DIAGNOSIS — Z79891 Long term (current) use of opiate analgesic: Secondary | ICD-10-CM | POA: Insufficient documentation

## 2015-07-28 DIAGNOSIS — E039 Hypothyroidism, unspecified: Secondary | ICD-10-CM | POA: Insufficient documentation

## 2015-07-28 DIAGNOSIS — Q6101 Congenital single renal cyst: Secondary | ICD-10-CM | POA: Diagnosis not present

## 2015-07-28 DIAGNOSIS — M1990 Unspecified osteoarthritis, unspecified site: Secondary | ICD-10-CM | POA: Insufficient documentation

## 2015-07-28 DIAGNOSIS — N359 Urethral stricture, unspecified: Secondary | ICD-10-CM | POA: Diagnosis not present

## 2015-07-28 DIAGNOSIS — Q613 Polycystic kidney, unspecified: Secondary | ICD-10-CM | POA: Insufficient documentation

## 2015-07-28 DIAGNOSIS — F17210 Nicotine dependence, cigarettes, uncomplicated: Secondary | ICD-10-CM | POA: Insufficient documentation

## 2015-07-28 HISTORY — DX: Crossing vessel and stricture of ureter without hydronephrosis: N13.5

## 2015-07-28 HISTORY — DX: Acute kidney failure, unspecified: N17.9

## 2015-07-28 HISTORY — DX: Renal dysplasia: Q61.4

## 2015-07-28 HISTORY — DX: Unspecified osteoarthritis, unspecified site: M19.90

## 2015-07-28 HISTORY — PX: CYSTOSCOPY W/ URETERAL STENT PLACEMENT: SHX1429

## 2015-07-28 HISTORY — DX: Gastro-esophageal reflux disease without esophagitis: K21.9

## 2015-07-28 HISTORY — DX: Presence of spectacles and contact lenses: Z97.3

## 2015-07-28 HISTORY — DX: Solitary pulmonary nodule: R91.1

## 2015-07-28 HISTORY — DX: Pre-excitation syndrome: I45.6

## 2015-07-28 SURGERY — CYSTOSCOPY, WITH RETROGRADE PYELOGRAM AND URETERAL STENT INSERTION
Anesthesia: General | Laterality: Right

## 2015-07-28 MED ORDER — HYDROMORPHONE HCL 1 MG/ML IJ SOLN
0.2500 mg | INTRAMUSCULAR | Status: DC | PRN
  Administered 2015-07-28 (×2): 0.5 mg via INTRAVENOUS
  Filled 2015-07-28: qty 1

## 2015-07-28 MED ORDER — ONDANSETRON HCL 4 MG/2ML IJ SOLN
INTRAMUSCULAR | Status: DC | PRN
  Administered 2015-07-28: 4 mg via INTRAVENOUS

## 2015-07-28 MED ORDER — LACTATED RINGERS IV SOLN
INTRAVENOUS | Status: DC
  Administered 2015-07-28: 12:00:00 via INTRAVENOUS
  Filled 2015-07-28: qty 1000

## 2015-07-28 MED ORDER — DEXAMETHASONE SODIUM PHOSPHATE 10 MG/ML IJ SOLN
INTRAMUSCULAR | Status: AC
  Filled 2015-07-28: qty 1

## 2015-07-28 MED ORDER — DEXAMETHASONE SODIUM PHOSPHATE 4 MG/ML IJ SOLN
INTRAMUSCULAR | Status: DC | PRN
  Administered 2015-07-28: 10 mg via INTRAVENOUS

## 2015-07-28 MED ORDER — CEFAZOLIN SODIUM-DEXTROSE 2-3 GM-% IV SOLR
INTRAVENOUS | Status: AC
  Filled 2015-07-28: qty 50

## 2015-07-28 MED ORDER — CEFAZOLIN SODIUM-DEXTROSE 2-3 GM-% IV SOLR
INTRAVENOUS | Status: DC | PRN
  Administered 2015-07-28: 2 g via INTRAVENOUS

## 2015-07-28 MED ORDER — MEPERIDINE HCL 25 MG/ML IJ SOLN
6.2500 mg | INTRAMUSCULAR | Status: DC | PRN
  Filled 2015-07-28: qty 1

## 2015-07-28 MED ORDER — OXYCODONE HCL 5 MG PO TABS
5.0000 mg | ORAL_TABLET | Freq: Once | ORAL | Status: AC | PRN
  Administered 2015-07-28: 5 mg via ORAL
  Filled 2015-07-28: qty 1

## 2015-07-28 MED ORDER — LIDOCAINE HCL (CARDIAC) 20 MG/ML IV SOLN
INTRAVENOUS | Status: DC | PRN
  Administered 2015-07-28: 100 mg via INTRAVENOUS

## 2015-07-28 MED ORDER — SULFAMETHOXAZOLE-TRIMETHOPRIM 800-160 MG PO TABS: 1.0000 | ORAL_TABLET | Freq: Two times a day (BID) | ORAL | Status: DC

## 2015-07-28 MED ORDER — STERILE WATER FOR IRRIGATION IR SOLN
Status: DC | PRN
  Administered 2015-07-28: 500 mL via INTRAVESICAL

## 2015-07-28 MED ORDER — PHENYLEPHRINE HCL 10 MG/ML IJ SOLN
INTRAMUSCULAR | Status: DC | PRN
  Administered 2015-07-28: 80 ug via INTRAVENOUS

## 2015-07-28 MED ORDER — PROMETHAZINE HCL 25 MG/ML IJ SOLN
6.2500 mg | INTRAMUSCULAR | Status: DC | PRN
Start: 2015-07-28 — End: 2015-07-28
  Filled 2015-07-28: qty 1

## 2015-07-28 MED ORDER — MIDAZOLAM HCL 5 MG/5ML IJ SOLN
INTRAMUSCULAR | Status: DC | PRN
  Administered 2015-07-28: 2 mg via INTRAVENOUS

## 2015-07-28 MED ORDER — LIDOCAINE HCL (CARDIAC) 20 MG/ML IV SOLN
INTRAVENOUS | Status: AC
  Filled 2015-07-28: qty 5

## 2015-07-28 MED ORDER — OXYCODONE HCL 5 MG PO TABS
ORAL_TABLET | ORAL | Status: AC
  Filled 2015-07-28: qty 1

## 2015-07-28 MED ORDER — FENTANYL CITRATE (PF) 100 MCG/2ML IJ SOLN
INTRAMUSCULAR | Status: AC
  Filled 2015-07-28: qty 2

## 2015-07-28 MED ORDER — ONDANSETRON HCL 4 MG/2ML IJ SOLN
INTRAMUSCULAR | Status: AC
  Filled 2015-07-28: qty 2

## 2015-07-28 MED ORDER — SODIUM CHLORIDE 0.9 % IR SOLN
Status: DC | PRN
  Administered 2015-07-28: 3000 mL via INTRAVESICAL
  Administered 2015-07-28: 1000 mL via INTRAVESICAL

## 2015-07-28 MED ORDER — PHENYLEPHRINE 40 MCG/ML (10ML) SYRINGE FOR IV PUSH (FOR BLOOD PRESSURE SUPPORT)
PREFILLED_SYRINGE | INTRAVENOUS | Status: AC
  Filled 2015-07-28: qty 10

## 2015-07-28 MED ORDER — MIDAZOLAM HCL 2 MG/2ML IJ SOLN
INTRAMUSCULAR | Status: AC
  Filled 2015-07-28: qty 2

## 2015-07-28 MED ORDER — IOHEXOL 350 MG/ML SOLN
INTRAVENOUS | Status: DC | PRN
  Administered 2015-07-28: 10 mL via URETHRAL

## 2015-07-28 MED ORDER — BELLADONNA ALKALOIDS-OPIUM 16.2-60 MG RE SUPP
RECTAL | Status: AC
  Filled 2015-07-28: qty 1

## 2015-07-28 MED ORDER — BELLADONNA ALKALOIDS-OPIUM 16.2-60 MG RE SUPP
1.0000 | Freq: Every day | RECTAL | Status: DC
  Administered 2015-07-28: 1 via RECTAL
  Filled 2015-07-28: qty 1

## 2015-07-28 MED ORDER — PROPOFOL 10 MG/ML IV BOLUS
INTRAVENOUS | Status: AC
  Filled 2015-07-28: qty 20

## 2015-07-28 MED ORDER — HYDROMORPHONE HCL 1 MG/ML IJ SOLN
INTRAMUSCULAR | Status: AC
  Filled 2015-07-28: qty 1

## 2015-07-28 MED ORDER — PROPOFOL 10 MG/ML IV BOLUS
INTRAVENOUS | Status: DC | PRN
  Administered 2015-07-28: 200 mg via INTRAVENOUS

## 2015-07-28 MED ORDER — FENTANYL CITRATE (PF) 100 MCG/2ML IJ SOLN
INTRAMUSCULAR | Status: DC | PRN
  Administered 2015-07-28: 50 ug via INTRAVENOUS
  Administered 2015-07-28: 100 ug via INTRAVENOUS

## 2015-07-28 MED ORDER — OXYCODONE HCL 5 MG/5ML PO SOLN
5.0000 mg | Freq: Once | ORAL | Status: AC | PRN
  Filled 2015-07-28: qty 5

## 2015-07-28 SURGICAL SUPPLY — 24 items
ADAPTER CATH URET PLST 4-6FR (CATHETERS) IMPLANT
ADPR CATH URET STRL DISP 4-6FR (CATHETERS)
BAG DRAIN URO-CYSTO SKYTR STRL (DRAIN) ×2 IMPLANT
BAG DRN UROCATH (DRAIN) ×1
BAG URINE LEG 500ML (DRAIN) ×2 IMPLANT
CANISTER SUCT LVC 12 LTR MEDI- (MISCELLANEOUS) IMPLANT
CATH FOLEY 2WAY SLVR  5CC 16FR (CATHETERS) ×1
CATH FOLEY 2WAY SLVR 5CC 16FR (CATHETERS) IMPLANT
CATH INTERMIT  6FR 70CM (CATHETERS) IMPLANT
CLOTH BEACON ORANGE TIMEOUT ST (SAFETY) ×2 IMPLANT
GLOVE BIO SURGEON STRL SZ8 (GLOVE) ×2 IMPLANT
GOWN STRL REUS W/ TWL XL LVL3 (GOWN DISPOSABLE) ×1 IMPLANT
GOWN STRL REUS W/TWL XL LVL3 (GOWN DISPOSABLE) ×2
GOWN XL W/COTTON TOWEL STD (GOWNS) ×2 IMPLANT
GUIDEWIRE 0.038 PTFE COATED (WIRE) IMPLANT
GUIDEWIRE ANG ZIPWIRE 038X150 (WIRE) IMPLANT
GUIDEWIRE STR DUAL SENSOR (WIRE) ×2 IMPLANT
KIT ROOM TURNOVER WOR (KITS) ×2 IMPLANT
MANIFOLD NEPTUNE II (INSTRUMENTS) IMPLANT
NS IRRIG 500ML POUR BTL (IV SOLUTION) IMPLANT
PACK CYSTO (CUSTOM PROCEDURE TRAY) ×2 IMPLANT
STENT URET 6FRX24 CONTOUR (STENTS) IMPLANT
STENT URET 6FRX26 CONTOUR (STENTS) ×1 IMPLANT
TUBE CONNECTING 12X1/4 (SUCTIONS) ×1 IMPLANT

## 2015-07-28 NOTE — Anesthesia Procedure Notes (Signed)
Procedure Name: LMA Insertion Date/Time: 07/28/2015 1:38 PM Performed by: Bethena Roys T Pre-anesthesia Checklist: Patient identified, Emergency Drugs available, Suction available and Patient being monitored Patient Re-evaluated:Patient Re-evaluated prior to inductionOxygen Delivery Method: Circle System Utilized Preoxygenation: Pre-oxygenation with 100% oxygen Intubation Type: IV induction Ventilation: Mask ventilation without difficulty LMA: LMA inserted LMA Size: 4.0 Number of attempts: 1 Airway Equipment and Method: Bite block Placement Confirmation: positive ETCO2 Dental Injury: Teeth and Oropharynx as per pre-operative assessment

## 2015-07-28 NOTE — Anesthesia Postprocedure Evaluation (Signed)
Anesthesia Post Note  Patient: Jennifer George  Procedure(s) Performed: Procedure(s) (LRB): CYSTOSCOPY WITH RETROGRADE PYELOGRAM/URETERAL STENT PLACEMENT (Right)  Patient location during evaluation: PACU Anesthesia Type: General Level of consciousness: awake and alert Pain management: pain level controlled Vital Signs Assessment: post-procedure vital signs reviewed and stable Respiratory status: spontaneous breathing, nonlabored ventilation, respiratory function stable and patient connected to nasal cannula oxygen Cardiovascular status: blood pressure returned to baseline and stable Postop Assessment: no signs of nausea or vomiting Anesthetic complications: no    Last Vitals:  Filed Vitals:   07/28/15 1500 07/28/15 1515  BP: 140/70 139/73  Pulse: 62 65  Temp:    Resp: 24 15    Last Pain: There were no vitals filed for this visit.               Braylin Xu L

## 2015-07-28 NOTE — Op Note (Signed)
PATIENT:  Jennifer George  PRE-OPERATIVE DIAGNOSIS: Right renal cyst with urinary drainage/communication with collecting system  POST-OPERATIVE DIAGNOSIS: Same  PROCEDURE: Cystoscopy, right retrograde ureteropyelogram, right double-J stent placement (26 centimeter by 6 French contour without string), interpretive fluoroscopy  SURGEON:  Lillette Boxer. Rainn Zupko, M.D.  ANESTHESIA:  General  EBL:  None  DRAINS: 52 French Foley catheter and bladder, 26 cm x 6 French contour double-J stent and right ureter  LOCAL MEDICATIONS USED:  None  SPECIMEN:  None  INDICATION: [name] is a 64 year old female with urine draining from a right lower pole renal cyst that was recently drained for purposes of pain management. She has a history of polycystic kidney disease. Her initial aspiration was last week, followed 2 days later by repeat aspiration due to re-formation of the cyst with pain. A drain was left in. To help limit the risk of the need for surgery to close this fistula, she presents at this time for cystoscopy, right retrograde ureteropyelogram, right double-J stent placement and Foley catheter drainage. The procedure as well as risks and alternatives have been discussed with the patient and her husband. They desire to proceed.  Description of procedure: The patient was properly identified and marked (if applicable) in the holding area. They were then  taken to the operating room and placed on the table in a supine position. General anesthesia was then administered. Once fully anesthetized the patient was moved to the dorsolithotomy position and the genitalia and perineum were sterilely prepped and draped in standard fashion. An official timeout was then performed.  The urethra was dilated to 53 [place] with female sounds, as there was significant stenosis. A 23 French panendoscope was then placed. The bladder was inspected circumferentially. There were no tumors foreign bodies are  trabeculations. Ureteral orifices were normal in configuration and location.  A 6 French open-ended catheter was utilized to perform a right retrograde ureteropyelogram with Omnipaque. The right ureter was normal throughout its length. There was perhaps some mild narrowing proximally in an area adjacent to small amount of possible retroperitoneal fibrosis. However, there was no significant dilatation of the right pyelocalyceal system. There were no filling defects. There was obvious communication between the lower pole calyx and what was more than likely the cyst, as a fair amount of fluid-filled a cavity underneath the calyx. The remaining pyelocalyceal system was normal.  I then passed a 0.038 inch guidewire up the right ureter, and fluoroscopically guided it into an upper pole calyx. Over top of this, I passed a 24 cm x 6 French contour double-J stent. After removal of the guidewire, I did not think that the proximal tip of the double-J stent curled up in the pelvis, so then I removed that, and similarly placed a 26 cm x 6 French contour stent. There was a Proximal curl in the pelvis at this point, as well as a curl in the bladder with cystoscopy. The bladder was drained. A 16 French Foley catheter was placed, the balloon filled with 10 mL of water, and hooked to a leg bag.  The patient was then awakened and taken to the PACU in stable condition. She tolerated the procedure well.

## 2015-07-28 NOTE — H&P (Signed)
H&P  Chief Complaint: Urinary drainage from the renal cyst on the right  History of Present Illness: Jennifer George is a 64 y.o. year old female With a history of polycystic kidney disease, status post recent cyst drainage by interventional radiology just over a week ago.  The cyst filled up within a day, and a percutaneous drain was placed and it a couple of days later.  There is been significant urinary drainage out of the percutaneous drain, consistent with urine.  At this point, the patient presents for cystoscopy, right retrograde ureteral pyelogram, double-J stent Placement as well asPlacement of indwelling Foley catheter to help drain the right renal system and hopefully help diminish the urine drainage from her right cyst.  Past Medical History  Diagnosis Date  . Hypothyroidism   . Depression   . WPW (Wolff-Parkinson-White syndrome) last visit cardiologist dr Caryl Comes 2010--  per pt no issues or symptoms since    WITH LEFT POSTEROLATERAL ACCESORY PATHWAY-- S/P  ABLATION  11-23-2008  . Pulmonary nodule     left side  per CT 07-19-2015  . Retroperitoneal fibrosis   . Mild acid reflux   . Acute kidney injury ([place])     07-20-2015  . Multicystic kidney     bilateral-- congenital  (right cyst drainage x2 by radiologist-- 07-20-2015 96ml and 07-22-2015  . Wears contact lenses   . Arthritis     Past Surgical History  Procedure Laterality Date  . Excision morton's neuroma  2000  . Posterior and enterocele repair/  cardinal uterosacral colposuspension  06-29-2009  . Cardiac electrophysiology study and ablation  11-23-2008   DR [facility]    Ablation left posterolateral accessory pathway  . Dilation and curettage of uterus  [place]  . Transthoracic echocardiogram  10-05-2008    normal LVF, ef 55-60%/  mild AV sclerosis and calcification without stenosis/  trivial MR and TR    Home Medications:  Medications Prior to Admission  Medication Sig Dispense Refill  . aspirin 81 MG  tablet Take 81 mg by mouth daily.    Marland Kitchen atorvastatin (LIPITOR) 20 MG tablet Take 20 mg by mouth every morning.   11  . bifidobacterium infantis (ALIGN) capsule Take 1 capsule by mouth every morning.     . cholecalciferol (VITAMIN D) 1000 UNITS tablet Take 1,000 Units by mouth daily.    . diclofenac (VOLTAREN) 75 MG EC tablet Take 75 mg by mouth daily. PT HAS NOT TAKEN SINCE 07-19-2015    . estradiol (VIVELLE-DOT) 0.05 MG/24HR patch Place 1 patch onto the skin 2 (two) times a week. Tues and Saturday.    . fexofenadine (ALLEGRA) 180 MG tablet Take 180 mg by mouth every morning.     Marland Kitchen levothyroxine (SYNTHROID, LEVOTHROID) 88 MCG tablet Take 88 mcg by mouth daily before breakfast. ON Sunday'S TAKES TWO TABLETS    . ondansetron (ZOFRAN) 4 MG tablet Take 1 tablet (4 mg total) by mouth every 6 (six) hours as needed for nausea. 30 tablet 0  . oxyCODONE (OXY IR/ROXICODONE) 5 MG immediate release tablet Take 1 tablet (5 mg total) by mouth every 4 (four) hours as needed for moderate pain or breakthrough pain. 30 tablet 0  . Polyethylene Glycol 3350 (MIRALAX PO) Take by mouth as needed.    . progesterone (PROMETRIUM) 200 MG capsule TAKE 1 CAPSULE(S) EVERY DAY BY ORAL ROUTE FOR 14 NIGHTS EVERY OTHER MONTH  6  . pyridOXINE (VITAMIN B-6) 100 MG tablet Take 100 mg by mouth daily.    Marland Kitchen  simethicone (MYLICON) 80 MG chewable tablet Chew 1 tablet (80 mg total) by mouth every 6 (six) hours as needed for flatulence. 30 tablet 0  . traZODone (DESYREL) 100 MG tablet Take 200-300 mg by mouth at bedtime.   5  . vitamin C (ASCORBIC ACID) 500 MG tablet Take 1,000 mg by mouth daily.      Allergies:  Allergies  Allergen Reactions  . Tape Other (See Comments)    Skin red irritation    History reviewed. No pertinent family history.  Social History:  reports that she has been smoking Cigarettes.  She has a 36.8 pack-year smoking history. She has never used smokeless tobacco. She reports that she drinks alcohol. She reports  that she does not use illicit drugs.  ROS: A complete review of systems was performed.  All systems are negative except for pertinent findings as noted.  Physical Exam:  Vital signs in last 24 hours: Temp:  [98.4 F (36.9 C)] 98.4 F (36.9 C) (12/09 1200) Pulse Rate:  [80] 80 (12/09 1200) Resp:  [10] 10 (12/09 1200) BP: (138)/(76) 138/76 mmHg (12/09 1200) SpO2:  [100 %] 100 % (12/09 1200) Weight:  [63.73 kg (140 lb 8 oz)] 63.73 kg (140 lb 8 oz) (12/09 1200) General:  Alert and oriented, No acute distress HEENT: Normocephalic, atraumatic Neck: No JVD or lymphadenopathy Cardiovascular: Regular rate and rhythm Lungs: Clear bilaterally Abdomen: Soft, nontender, nondistended, no abdominal masses Back: No CVA tenderness Extremities: No edema Neurologic: Grossly intact  Laboratory Data:  No results found for this or any previous visit (from the past 24 hour(s)). Recent Results (from the past 240 hour(s))  Culture, body fluid-bottle     Status: None   Collection Time: 07/21/15 12:14 PM  Result Value Ref Range Status   Specimen Description FLUID RIGHT RENAL CYSTS  Final   Special Requests NONE  Final   Culture   Final    NO GROWTH 5 DAYS Performed at [facility]    Report Status 07/26/2015 FINAL  Final  Gram stain     Status: None   Collection Time: 07/21/15 12:14 PM  Result Value Ref Range Status   Specimen Description FLUID RIGHT RENAL CYSTS  Final   Special Requests NONE  Final   Gram Stain   Final    RARE WBC PRESENT,BOTH PMN AND MONONUCLEAR NO ORGANISMS SEEN Performed at [facility]    Report Status 07/21/2015 FINAL  Final   Creatinine:  Recent Labs  07/22/15 0522 07/23/15 0520  CREATININE 1.54* 1.10*    Radiologic Imaging: No results found.  Impression/Assessment:  Polycystic kidney disease, status post recent cyst drainage, now with urine drainage out her cyst, drained with percutaneous placed drain  Plan:   cystoscopy, right retrograde  ureteropyelogram,Right double-J stent placement as well as catheter drainage of her bladder.  Jorja Loa 07/28/2015, 12:49 PM  Lillette Boxer. Laylia Mui MD

## 2015-07-28 NOTE — Discharge Instructions (Signed)
1. You may see some blood in the urine and may have some burning with urination for 48-72 hours. You also may notice that you have to urinate more frequently or urgently after your procedure which is normal.  °2. You should call should you develop an inability urinate, fever > 101, persistent nausea and vomiting that prevents you from eating or drinking to stay hydrated.  °3. If you have a stent, you will likely urinate more frequently and urgently until the stent is removed and you may experience some discomfort/pain in the lower abdomen and flank especially when urinating. You may take pain medication prescribed to you if needed for pain. You may also intermittently have blood in the urine until the stent is removed. °If you have a catheter, you will be taught how to take care of the catheter by the nursing staff prior to discharge from the hospital.  You may periodically feel a strong urge to void with the catheter in place.  This is a bladder spasm and most often can occur when having a bowel movement or moving around. It is typically self-limited and usually will stop after a few minutes.  You may use some Vaseline or Neosporin around the tip of the catheter to reduce friction at the tip of the penis. You may also see some blood in the urine.  A very small amount of blood can make the urine look quite red.  As long as the catheter is draining well, there usually is not a problem.  However, if the catheter is not draining well and is bloody, you should call the office ([phone]) to notify us. °Post Anesthesia Home Care Instructions ° °Activity: °Get plenty of rest for the remainder of the day. A responsible adult should stay with you for 24 hours following the procedure.  °For the next 24 hours, DO NOT: °-Drive a car °-Operate machinery °-Drink alcoholic beverages °-Take any medication unless instructed by your physician °-Make any legal decisions or sign important papers. ° °Meals: °Start with liquid foods  such as gelatin or soup. Progress to regular foods as tolerated. Avoid greasy, spicy, heavy foods. If nausea and/or vomiting occur, drink only clear liquids until the nausea and/or vomiting subsides. Call your physician if vomiting continues. ° °Special Instructions/Symptoms: °Your throat may feel dry or sore from the anesthesia or the breathing tube placed in your throat during surgery. If this causes discomfort, gargle with warm salt water. The discomfort should disappear within 24 hours. ° °If you had a scopolamine patch placed behind your ear for the management of post- operative nausea and/or vomiting: ° °1. The medication in the patch is effective for 72 hours, after which it should be removed.  Wrap patch in a tissue and discard in the trash. Wash hands thoroughly with soap and water. °2. You may remove the patch earlier than 72 hours if you experience unpleasant side effects which may include dry mouth, dizziness or visual disturbances. °3. Avoid touching the patch. Wash your hands with soap and water after contact with the patch. °  °4.  °

## 2015-07-28 NOTE — Anesthesia Preprocedure Evaluation (Addendum)
Anesthesia Evaluation  Patient identified by MRN, date of birth, ID band Patient awake    Reviewed: Allergy & Precautions, NPO status , Patient's Chart, lab work & pertinent test results  Airway Mallampati: II  TM Distance: >3 FB Neck ROM: Full    Dental no notable dental hx.    Pulmonary neg pulmonary ROS, Current Smoker,    Pulmonary exam normal breath sounds clear to auscultation       Cardiovascular negative cardio ROS Normal cardiovascular exam Rhythm:Regular Rate:Normal     Neuro/Psych PSYCHIATRIC DISORDERS Depression negative neurological ROS     GI/Hepatic Neg liver ROS, GERD  ,  Endo/Other  negative endocrine ROSHypothyroidism   Renal/GU Renal disease     Musculoskeletal  (+) Arthritis ,   Abdominal   Peds  Hematology negative hematology ROS (+)   Anesthesia Other Findings   Reproductive/Obstetrics negative OB ROS                            Anesthesia Physical Anesthesia Plan  ASA: III  Anesthesia Plan: General   Post-op Pain Management:    Induction: Intravenous  Airway Management Planned: LMA  Additional Equipment:   Intra-op Plan:   Post-operative Plan: Extubation in OR  Informed Consent: I have reviewed the patients History and Physical, chart, labs and discussed the procedure including the risks, benefits and alternatives for the proposed anesthesia with the patient or authorized representative who has indicated his/her understanding and acceptance.   Dental advisory given  Plan Discussed with: CRNA  Anesthesia Plan Comments:         Anesthesia Quick Evaluation

## 2015-07-28 NOTE — Transfer of Care (Signed)
Immediate Anesthesia Transfer of Care Note  Patient: Jennifer George  Procedure(s) Performed: Procedure(s): CYSTOSCOPY WITH RETROGRADE PYELOGRAM/URETERAL STENT PLACEMENT (Right)  Patient Location: PACU  Anesthesia Type:General  Level of Consciousness: awake, alert  and oriented  Airway & Oxygen Therapy: Patient Spontanous Breathing and Patient connected to nasal cannula oxygen  Post-op Assessment: Report given to RN  Post vital signs: Reviewed and stable  Last Vitals:  Filed Vitals:   07/28/15 1200  BP: 138/76  Pulse: 80  Temp: 36.9 C  Resp: 10    Complications: No apparent anesthesia complications

## 2015-07-31 ENCOUNTER — Encounter (HOSPITAL_BASED_OUTPATIENT_CLINIC_OR_DEPARTMENT_OTHER): Payer: Self-pay | Admitting: Urology

## 2015-08-15 ENCOUNTER — Other Ambulatory Visit: Payer: Self-pay | Admitting: Urology

## 2015-08-15 DIAGNOSIS — N281 Cyst of kidney, acquired: Secondary | ICD-10-CM

## 2015-08-18 ENCOUNTER — Ambulatory Visit (HOSPITAL_COMMUNITY)
Admission: RE | Admit: 2015-08-18 | Discharge: 2015-08-18 | Disposition: A | Discharge status: Home / Self Care | Payer: BLUE CROSS/BLUE SHIELD | Source: Ambulatory Visit | Attending: Urology | Admitting: Urology

## 2015-08-18 DIAGNOSIS — Z4803 Encounter for change or removal of drains: Secondary | ICD-10-CM | POA: Diagnosis present

## 2015-08-18 DIAGNOSIS — N281 Cyst of kidney, acquired: Secondary | ICD-10-CM | POA: Diagnosis not present

## 2015-08-18 MED ORDER — IOHEXOL 300 MG/ML  SOLN
20.0000 mL | Freq: Once | INTRAMUSCULAR | Status: AC | PRN
  Administered 2015-08-18: 20 mL

## 2015-08-23 ENCOUNTER — Other Ambulatory Visit: Payer: Self-pay | Admitting: Urology

## 2015-08-24 ENCOUNTER — Encounter (HOSPITAL_BASED_OUTPATIENT_CLINIC_OR_DEPARTMENT_OTHER): Payer: Self-pay | Admitting: *Deleted

## 2015-08-24 NOTE — Progress Notes (Addendum)
NPO AFTER MN.  ARRIVE AT 1100.  NEEDS HG.  CURRENT EKG IN CHART AND EPIC.  WILL TAKE SYNTHROID, ALLEGRA, AND LIPITOR AM DOS W/ SIPS OF WATER.

## 2015-08-28 ENCOUNTER — Ambulatory Visit (HOSPITAL_BASED_OUTPATIENT_CLINIC_OR_DEPARTMENT_OTHER): Payer: No Typology Code available for payment source | Admitting: Anesthesiology

## 2015-08-28 ENCOUNTER — Encounter (HOSPITAL_BASED_OUTPATIENT_CLINIC_OR_DEPARTMENT_OTHER)
Admission: RE | Disposition: A | Discharge status: Home / Self Care | Payer: Self-pay | Source: Ambulatory Visit | Attending: Urology

## 2015-08-28 ENCOUNTER — Encounter (HOSPITAL_BASED_OUTPATIENT_CLINIC_OR_DEPARTMENT_OTHER): Payer: Self-pay

## 2015-08-28 ENCOUNTER — Ambulatory Visit (HOSPITAL_BASED_OUTPATIENT_CLINIC_OR_DEPARTMENT_OTHER)
Admission: RE | Admit: 2015-08-28 | Discharge: 2015-08-28 | Disposition: A | Discharge status: Home / Self Care | Payer: No Typology Code available for payment source | Source: Ambulatory Visit | Attending: Urology | Admitting: Urology

## 2015-08-28 DIAGNOSIS — Z791 Long term (current) use of non-steroidal anti-inflammatories (NSAID): Secondary | ICD-10-CM | POA: Insufficient documentation

## 2015-08-28 DIAGNOSIS — E039 Hypothyroidism, unspecified: Secondary | ICD-10-CM | POA: Diagnosis not present

## 2015-08-28 DIAGNOSIS — N281 Cyst of kidney, acquired: Secondary | ICD-10-CM | POA: Insufficient documentation

## 2015-08-28 DIAGNOSIS — Z7982 Long term (current) use of aspirin: Secondary | ICD-10-CM | POA: Diagnosis not present

## 2015-08-28 DIAGNOSIS — Z79899 Other long term (current) drug therapy: Secondary | ICD-10-CM | POA: Insufficient documentation

## 2015-08-28 DIAGNOSIS — K219 Gastro-esophageal reflux disease without esophagitis: Secondary | ICD-10-CM | POA: Diagnosis not present

## 2015-08-28 DIAGNOSIS — M1990 Unspecified osteoarthritis, unspecified site: Secondary | ICD-10-CM | POA: Insufficient documentation

## 2015-08-28 DIAGNOSIS — Z79891 Long term (current) use of opiate analgesic: Secondary | ICD-10-CM | POA: Insufficient documentation

## 2015-08-28 DIAGNOSIS — F17210 Nicotine dependence, cigarettes, uncomplicated: Secondary | ICD-10-CM | POA: Diagnosis not present

## 2015-08-28 DIAGNOSIS — Q612 Polycystic kidney, adult type: Secondary | ICD-10-CM | POA: Diagnosis not present

## 2015-08-28 DIAGNOSIS — N289 Disorder of kidney and ureter, unspecified: Secondary | ICD-10-CM | POA: Diagnosis not present

## 2015-08-28 HISTORY — PX: CYSTOSCOPY W/ RETROGRADES: SHX1426

## 2015-08-28 HISTORY — PX: CYSTOSCOPY W/ URETERAL STENT REMOVAL: SHX1430

## 2015-08-28 LAB — POCT HEMOGLOBIN-HEMACUE: Hemoglobin: 14.1 g/dL (ref 12.0–15.0)

## 2015-08-28 SURGERY — CYSTOSCOPY, WITH RETROGRADE PYELOGRAM
Anesthesia: General | Laterality: Right

## 2015-08-28 MED ORDER — DEXAMETHASONE SODIUM PHOSPHATE 4 MG/ML IJ SOLN
INTRAMUSCULAR | Status: DC | PRN
  Administered 2015-08-28: 10 mg via INTRAVENOUS

## 2015-08-28 MED ORDER — PHENAZOPYRIDINE HCL 100 MG PO TABS
ORAL_TABLET | ORAL | Status: AC
  Filled 2015-08-28: qty 2

## 2015-08-28 MED ORDER — ONDANSETRON HCL 4 MG/2ML IJ SOLN
INTRAMUSCULAR | Status: DC | PRN
  Administered 2015-08-28: 4 mg via INTRAVENOUS

## 2015-08-28 MED ORDER — FENTANYL CITRATE (PF) 100 MCG/2ML IJ SOLN
INTRAMUSCULAR | Status: AC
  Filled 2015-08-28: qty 4

## 2015-08-28 MED ORDER — MIDAZOLAM HCL 5 MG/5ML IJ SOLN
INTRAMUSCULAR | Status: DC | PRN
  Administered 2015-08-28: 2 mg via INTRAVENOUS

## 2015-08-28 MED ORDER — FENTANYL CITRATE (PF) 100 MCG/2ML IJ SOLN
INTRAMUSCULAR | Status: DC | PRN
  Administered 2015-08-28 (×2): 50 ug via INTRAVENOUS

## 2015-08-28 MED ORDER — LACTATED RINGERS IV SOLN
INTRAVENOUS | Status: DC
  Administered 2015-08-28: 12:00:00 via INTRAVENOUS
  Filled 2015-08-28: qty 1000

## 2015-08-28 MED ORDER — PROPOFOL 10 MG/ML IV BOLUS
INTRAVENOUS | Status: AC
  Filled 2015-08-28: qty 20

## 2015-08-28 MED ORDER — SODIUM CHLORIDE 0.9 % IR SOLN
Status: DC | PRN
  Administered 2015-08-28: 3000 mL via INTRAVESICAL

## 2015-08-28 MED ORDER — MIDAZOLAM HCL 2 MG/2ML IJ SOLN
INTRAMUSCULAR | Status: AC
  Filled 2015-08-28: qty 2

## 2015-08-28 MED ORDER — HYDROCODONE-ACETAMINOPHEN 7.5-325 MG PO TABS
1.0000 | ORAL_TABLET | Freq: Once | ORAL | Status: DC | PRN
  Filled 2015-08-28: qty 1

## 2015-08-28 MED ORDER — OXYCODONE HCL 5 MG PO TABS
ORAL_TABLET | ORAL | Status: AC
  Filled 2015-08-28: qty 1

## 2015-08-28 MED ORDER — LIDOCAINE HCL (CARDIAC) 20 MG/ML IV SOLN
INTRAVENOUS | Status: DC | PRN
  Administered 2015-08-28: 60 mg via INTRAVENOUS

## 2015-08-28 MED ORDER — DEXAMETHASONE SODIUM PHOSPHATE 10 MG/ML IJ SOLN
INTRAMUSCULAR | Status: AC
  Filled 2015-08-28: qty 1

## 2015-08-28 MED ORDER — FENTANYL CITRATE (PF) 100 MCG/2ML IJ SOLN
25.0000 ug | INTRAMUSCULAR | Status: DC | PRN
  Filled 2015-08-28: qty 1

## 2015-08-28 MED ORDER — CIPROFLOXACIN IN D5W 400 MG/200ML IV SOLN
400.0000 mg | INTRAVENOUS | Status: AC
  Administered 2015-08-28: 400 mg via INTRAVENOUS
  Filled 2015-08-28: qty 200

## 2015-08-28 MED ORDER — PROMETHAZINE HCL 25 MG/ML IJ SOLN
6.2500 mg | INTRAMUSCULAR | Status: DC | PRN
  Filled 2015-08-28: qty 1

## 2015-08-28 MED ORDER — PROPOFOL 10 MG/ML IV BOLUS
INTRAVENOUS | Status: DC | PRN
  Administered 2015-08-28: 150 mg via INTRAVENOUS

## 2015-08-28 MED ORDER — CIPROFLOXACIN IN D5W 400 MG/200ML IV SOLN
INTRAVENOUS | Status: AC
  Filled 2015-08-28: qty 200

## 2015-08-28 MED ORDER — OXYCODONE HCL 5 MG PO TABS
5.0000 mg | ORAL_TABLET | Freq: Once | ORAL | Status: AC
  Administered 2015-08-28: 5 mg via ORAL
  Filled 2015-08-28: qty 1

## 2015-08-28 MED ORDER — IOHEXOL 350 MG/ML SOLN
INTRAVENOUS | Status: DC | PRN
  Administered 2015-08-28: 20 mL via URETHRAL

## 2015-08-28 MED ORDER — PHENAZOPYRIDINE HCL 200 MG PO TABS
200.0000 mg | ORAL_TABLET | Freq: Three times a day (TID) | ORAL | Status: DC
  Administered 2015-08-28: 200 mg via ORAL
  Filled 2015-08-28: qty 1

## 2015-08-28 SURGICAL SUPPLY — 25 items
ADAPTER CATH URET PLST 4-6FR (CATHETERS) IMPLANT
ADAPTER IRRIG TUBE 2 SPIKE SOL (ADAPTER) ×1 IMPLANT
ADPR CATH URET STRL DISP 4-6FR (CATHETERS)
ADPR TBG 2 SPK PMP STRL ASCP (ADAPTER) ×1
BAG DRAIN URO-CYSTO SKYTR STRL (DRAIN) ×2 IMPLANT
BAG DRN UROCATH (DRAIN) ×1
CANISTER SUCT LVC 12 LTR MEDI- (MISCELLANEOUS) IMPLANT
CATH INTERMIT  6FR 70CM (CATHETERS) IMPLANT
CATH URET 5FR 28IN CONE TIP (BALLOONS)
CATH URET 5FR 28IN OPEN ENDED (CATHETERS) IMPLANT
CATH URET 5FR 70CM CONE TIP (BALLOONS) IMPLANT
CLOTH BEACON ORANGE TIMEOUT ST (SAFETY) ×2 IMPLANT
GLOVE BIO SURGEON STRL SZ8 (GLOVE) ×2 IMPLANT
GOWN STRL REUS W/ TWL LRG LVL3 (GOWN DISPOSABLE) ×1 IMPLANT
GOWN STRL REUS W/ TWL XL LVL3 (GOWN DISPOSABLE) ×1 IMPLANT
GOWN STRL REUS W/TWL LRG LVL3 (GOWN DISPOSABLE) ×2
GOWN STRL REUS W/TWL XL LVL3 (GOWN DISPOSABLE) ×2
GUIDEWIRE 0.038 PTFE COATED (WIRE) IMPLANT
GUIDEWIRE ANG ZIPWIRE 038X150 (WIRE) IMPLANT
GUIDEWIRE STR DUAL SENSOR (WIRE) IMPLANT
KIT ROOM TURNOVER WOR (KITS) ×2 IMPLANT
MANIFOLD NEPTUNE II (INSTRUMENTS) IMPLANT
NS IRRIG 500ML POUR BTL (IV SOLUTION) IMPLANT
PACK CYSTO (CUSTOM PROCEDURE TRAY) ×2 IMPLANT
TUBE CONNECTING 12X1/4 (SUCTIONS) IMPLANT

## 2015-08-28 NOTE — H&P (Signed)
H&P  Chief Complaint: Kidney cyst  History of Present Illness: Jennifer George is a 65 y.o. year old female presenting for cysto, extraction of right J2 stent, right RGP, possible stent replacement.   She has adult polycystic kidney disease, and is status post several cyst aspirations of a dominant right lower pole cyst. Her last cyst aspiration was in early December, 2016. 2 days after her cyst aspiration, it filled up again and she has significant discomfort. She had a drain left in on repeat cyst aspiration 2 days later. It turned out that the drainage fluid was consistent with urine, and a drain was left in. She then underwent cystoscopy, retrograde pyelogram on the right, double-J stent placement and Foley catheter placement.  At her last visit here, she was scheduled for cystogram by interventional radiology. Unfortunately, the cyst was significantly decompressed, and contrast injection of the cyst revealed free passage of the contrast into the peritoneal cavity. The drain was then removed.  Shortly after her interventional radiology procedure 2 weeks ago, she did have abdominal pain and a low-grade fever. That is now improved. She is having no pain and no fever. She still has a stent and a catheter. To verify that there is no communiation between her cyst and collecting system  Past Medical History  Diagnosis Date  . Hypothyroidism   . Depression   . WPW (Wolff-Parkinson-White syndrome) last visit cardiologist dr Caryl Comes 2010--  per pt no issues or symptoms since    WITH LEFT POSTEROLATERAL ACCESORY PATHWAY-- S/P  ABLATION  11-23-2008  . Pulmonary nodule     left side  per CT 07-19-2015  . Retroperitoneal fibrosis   . Mild acid reflux   . Wears contact lenses   . Arthritis   . Acute kidney injury ([place])     07-20-2015  . Multicystic kidney     bilateral-- congenital  (right cyst drainage x2 by radiologist-- 07-20-2015 981ml and 07-22-2015    Past Surgical History  Procedure  Laterality Date  . Excision morton's neuroma  2000  . Posterior and enterocele repair/  cardinal uterosacral colposuspension  06-29-2009  . Cardiac electrophysiology study and ablation  11-23-2008   DR [facility]    Ablation left posterolateral accessory pathway  . Dilation and curettage of uterus  [place]  . Transthoracic echocardiogram  10-05-2008    normal LVF, ef 55-60%/  mild AV sclerosis and calcification without stenosis/  trivial MR and TR  . Cystoscopy w/ ureteral stent placement Right 07/28/2015    Procedure: CYSTOSCOPY WITH RETROGRADE PYELOGRAM/URETERAL STENT PLACEMENT;  Surgeon: Franchot Gallo, MD;  Location: [facility];  Service: Urology;  Laterality: Right;    Home Medications:  Medications Prior to Admission  Medication Sig Dispense Refill  . acetaminophen (TYLENOL) 500 MG tablet Take 1,000 mg by mouth every 6 (six) hours as needed.    Marland Kitchen aspirin 81 MG tablet Take 81 mg by mouth daily.    Marland Kitchen atorvastatin (LIPITOR) 20 MG tablet Take 20 mg by mouth every morning.   11  . bifidobacterium infantis (ALIGN) capsule Take 1 capsule by mouth every morning.     . cholecalciferol (VITAMIN D) 1000 UNITS tablet Take 1,000 Units by mouth daily.    . diclofenac (VOLTAREN) 75 MG EC tablet Take 75 mg by mouth daily. PT HAS NOT TAKEN SINCE 07-19-2015    . estradiol (VIVELLE-DOT) 0.05 MG/24HR patch Place 1 patch onto the skin 2 (two) times a week. Tues and Saturday.    Marland Kitchen  fexofenadine (ALLEGRA) 180 MG tablet Take 180 mg by mouth every morning.     Marland Kitchen levothyroxine (SYNTHROID, LEVOTHROID) 88 MCG tablet Take 88 mcg by mouth daily before breakfast. ON Sunday'S TAKES TWO TABLETS    . ondansetron (ZOFRAN) 4 MG tablet Take 1 tablet (4 mg total) by mouth every 6 (six) hours as needed for nausea. 30 tablet 0  . oxyCODONE (OXY IR/ROXICODONE) 5 MG immediate release tablet Take 1 tablet (5 mg total) by mouth every 4 (four) hours as needed for moderate pain or breakthrough pain. 30 tablet 0  .  Polyethylene Glycol 3350 (MIRALAX PO) Take by mouth every morning.     . progesterone (PROMETRIUM) 200 MG capsule TAKE 1 CAPSULE(S) EVERY DAY BY ORAL ROUTE FOR 14 NIGHTS EVERY OTHER MONTH  6  . pyridOXINE (VITAMIN B-6) 100 MG tablet Take 100 mg by mouth daily.    . simethicone (MYLICON) 80 MG chewable tablet Chew 1 tablet (80 mg total) by mouth every 6 (six) hours as needed for flatulence. 30 tablet 0  . traZODone (DESYREL) 100 MG tablet Take 200-300 mg by mouth at bedtime.   5  . vitamin C (ASCORBIC ACID) 500 MG tablet Take 1,000 mg by mouth daily.      Allergies:  Allergies  Allergen Reactions  . Tape Other (See Comments)    Skin red irritation    History reviewed. No pertinent family history.  Social History:  reports that she has been smoking Cigarettes.  She has a 36.8 pack-year smoking history. She has never used smokeless tobacco. She reports that she drinks alcohol. She reports that she does not use illicit drugs.  ROS: A complete review of systems was performed.  All systems are negative except for pertinent findings as noted.  Physical Exam:  Vital signs in last 24 hours: Temp:  [97.7 F (36.5 C)] 97.7 F (36.5 C) (01/09 1109) Pulse Rate:  [106] 106 (01/09 1109) Resp:  [14] 14 (01/09 1109) BP: (115)/(48) 115/48 mmHg (01/09 1109) SpO2:  [100 %] 100 % (01/09 1109) Weight:  [61.689 kg (136 lb)] 61.689 kg (136 lb) (01/09 1109) General:  Alert and oriented, No acute distress HEENT: Normocephalic, atraumatic Neck: No JVD or lymphadenopathy Cardiovascular: Regular rate and rhythm Lungs: Clear bilaterally Abdomen: Soft, nontender, nondistended, no abdominal masses Back: No CVA tenderness Extremities: No edema Neurologic: Grossly intact  Laboratory Data:  No results found for this or any previous visit (from the past 24 hour(s)). No results found for this or any previous visit (from the past 240 hour(s)). Creatinine: No results for input(s): CREATININE in the last 168  hours.  Radiologic Imaging: No results found.  Impression/Assessment:  Right renal cyst with connection between that and collection system  Plan:  Cystoscopy, right J2 stent extraction, right RGP, possible stent replacement  Avyukt Cimo M 08/28/2015, 11:12 AM  Lillette Boxer. Isadore Bokhari MD

## 2015-08-28 NOTE — Discharge Instructions (Signed)
1. You may see some blood in the urine and may have some burning with urination for 48-72 hours. You also may notice that you have to urinate more frequently or urgently after your procedure which is normal.  °2. You should call should you develop an inability urinate, fever > 101, persistent nausea and vomiting that prevents you from eating or drinking to stay hydrated.  °3. If you have a stent, you will likely urinate more frequently and urgently until the stent is removed and you may experience some discomfort/pain in the lower abdomen and flank especially when urinating. You may take pain medication prescribed to you if needed for pain. You may also intermittently have blood in the urine until the stent is removed. °4. If you have a catheter, you will be taught how to take care of the catheter by the nursing staff prior to discharge from the hospital.  You may periodically feel a strong urge to void with the catheter in place.  This is a bladder spasm and most often can occur when having a bowel movement or moving around. It is typically self-limited and usually will stop after a few minutes.  You may use some Vaseline or Neosporin around the tip of the catheter to reduce friction at the tip of the penis. You may also see some blood in the urine.  A very small amount of blood can make the urine look quite red.  As long as the catheter is draining well, there usually is not a problem.  However, if the catheter is not draining well and is bloody, you should call the office ([phone]) to notify us. ° °CYSTOSCOPY HOME CARE INSTRUCTIONS ° °Activity: °Rest for the remainder of the day.  Do not drive or operate equipment today.  You may resume normal activities in one to two days as instructed by your physician.  ° °Meals: °Drink plenty of liquids and eat light foods such as gelatin or soup this evening.  You may return to a normal meal plan tomorrow. ° °Return to Work: °You may return to work in one to two days or  as instructed by your physician. ° °Special Instructions / Symptoms: °Call your physician if any of these symptoms occur: ° ° -persistent or heavy bleeding ° -bleeding which continues after first few urination ° -large blood clots that are difficult to pass ° -urine stream diminishes or stops completely ° -fever equal to or higher than 101 degrees Farenheit. ° -cloudy urine with a strong, foul odor ° -severe pain ° °Females should always wipe from front to back after elimination.  You may feel some burning pain when you urinate.  This should disappear with time.  Applying moist heat to the lower abdomen or a hot tub bath may help relieve the pain. \ ° °Follow-Up / Date of Return Visit to Your Physician:  Call for an appointment to arrange follow-up. ° °Patient Signature:  ________________________________________________________ ° °Nurse's Signature:  ________________________________________________________ ° °Post Anesthesia Home Care Instructions ° °Activity: °Get plenty of rest for the remainder of the day. A responsible adult should stay with you for 24 hours following the procedure.  °For the next 24 hours, DO NOT: °-Drive a car °-Operate machinery °-Drink alcoholic beverages °-Take any medication unless instructed by your physician °-Make any legal decisions or sign important papers. ° °Meals: °Start with liquid foods such as gelatin or soup. Progress to regular foods as tolerated. Avoid greasy, spicy, heavy foods. If nausea and/or vomiting occur, drink only   clear liquids until the nausea and/or vomiting subsides. Call your physician if vomiting continues. ° °Special Instructions/Symptoms: °Your throat may feel dry or sore from the anesthesia or the breathing tube placed in your throat during surgery. If this causes discomfort, gargle with warm salt water. The discomfort should disappear within 24 hours. ° °If you had a scopolamine patch placed behind your ear for the management of post- operative nausea and/or  vomiting: ° °1. The medication in the patch is effective for 72 hours, after which it should be removed.  Wrap patch in a tissue and discard in the trash. Wash hands thoroughly with soap and water. °2. You may remove the patch earlier than 72 hours if you experience unpleasant side effects which may include dry mouth, dizziness or visual disturbances. °3. Avoid touching the patch. Wash your hands with soap and water after contact with the patch. °  ° °

## 2015-08-28 NOTE — Anesthesia Procedure Notes (Signed)
Procedure Name: LMA Insertion Date/Time: 08/28/2015 12:20 PM Performed by: Wanita Chamberlain Pre-anesthesia Checklist: Patient identified, Timeout performed, Emergency Drugs available, Suction available and Patient being monitored Patient Re-evaluated:Patient Re-evaluated prior to inductionOxygen Delivery Method: Circle system utilized Preoxygenation: Pre-oxygenation with 100% oxygen Intubation Type: IV induction Ventilation: Mask ventilation without difficulty LMA: LMA inserted LMA Size: 4.0 Number of attempts: 1 Placement Confirmation: breath sounds checked- equal and bilateral and positive ETCO2 Tube secured with: Tape Dental Injury: Teeth and Oropharynx as per pre-operative assessment

## 2015-08-28 NOTE — Op Note (Signed)
PATIENT:  Jennifer George  PRE-OPERATIVE DIAGNOSIS: Adult polycystic kidney disease with communication between the lower pole cyst in her lower pole calyx  POST-OPERATIVE DIAGNOSIS: No evidence of communication between the lower pole cyst in the calyceal system  PROCEDURE: Cystoscopy, right double-J stent extraction, right retrograde ureteropyelogram with interpretive fluoroscopy  SURGEON:  Lillette Boxer. Wyoma Genson, M.D.  ANESTHESIA:  General  EBL:  Minimal  DRAINS: None  LOCAL MEDICATIONS USED:  None  SPECIMEN:  None  INDICATION: Jennifer George is a 65 year old female with a history of adult polycystic kidney disease. I recently consulted on her about a month ago, following interventional radiology draining a right lower pole renal cyst, ordered by her primary care physician. That had been done several times in the past as well for painful, large right lower pole cyst. There was rapid refilling of the lower pole cyst after the percutaneous drainage, and a percutaneous drain was then placed. The drainage was obviously urine, and the drain was left in area she subsequently had cystoscopy, right retrograde showing leakage of contrast from the lower pole calyx, and then underwent a double-J stent placement in a Foley catheter placement. Recently, she had a cystogram performed through the previously placed percutaneous drain. This showed that the drain was outside of the collecting system, and the drain was subsequently removed. Since it has been over a month since she has had her cystoscopy and double-J stent placement, she presents this time for cystoscopy, removal of her stent and retrograde ureteropyelogram to ensure that there is no communication between the lower pole cyst and the collecting system.  Description of procedure: The patient was properly identified and marked (if applicable) in the holding area. They were then  taken to the operating room and placed on the table in a supine  position. General anesthesia was then administered. Once fully anesthetized the patient was moved to the dorsolithotomy position and the genitalia and perineum were sterilely prepped and draped in standard fashion. An official timeout was then performed.  The cystoscope was placed into her bladder which was inspected circumferentially. There were no tumors, trabeculations or foreign bodies. Mild generalized erythema from the presence of catheter over the past month. Double-J stent resident the right ureteral orifice. This was removed with the grasping forceps. I then used a 6 [place] open-ended catheter to perform a retrograde pyelogram on the right.  This revealed a totally normal but slightly dilated ureter without evidence of filling defect or stricture. This was appropriate as the patient has had a stent for one month. The pyelo-calyceal system filled slight amount. This revealed somewhat of a dilated renal pelvis. I then, with the help of a 0.038 inch sensor-tip guidewire, advanced the open-ended catheter into the renal pelvis. I then used Omnipaque to perform a pyelogram. This revealed, again, dilated pelvis. There were multiple calyces seen, all of these were somewhat blunted/clubbed. I saw no evidence of filling defects. Because patient had had a connection between the lower pole calyx in the lower pole cyst, I did inject with [place] of pressure. This revealed no evidence of extravasation other than some papillary blushing throughout the calyceal system. Careful attention was paid to the lower pole calyx, and no extravasation was seen. At this point, I did decompress the renal pelvis somewhat by draining the contrast with the syringe.  As there was no evidence of extravasation, I did not feel like a stent needed to be left. The open-ended catheter was removed, the bladder drained, and the  procedure terminated. The patient tolerated the procedure well.      PLAN OF CARE: Discharge to home after  PACU  PATIENT DISPOSITION:  PACU - hemodynamically stable.

## 2015-08-28 NOTE — Transfer of Care (Signed)
Immediate Anesthesia Transfer of Care Note  Patient: Jennifer George  Procedure(s) Performed: Procedure(s): CYSTOSCOPY WITH RETROGRADE PYELOGRAM (Right) CYSTOSCOPY WITH STENT REMOVAL (Right)  Patient Location: PACU  Anesthesia Type:General  Level of Consciousness: awake, alert , oriented and patient cooperative  Airway & Oxygen Therapy: Patient Spontanous Breathing and Patient connected to nasal cannula oxygen  Post-op Assessment: Report given to RN and Post -op Vital signs reviewed and stable  Post vital signs: Reviewed and stable  Last Vitals:  Filed Vitals:   08/28/15 1109  BP: 115/48  Pulse: 106  Temp: 36.5 C  Resp: 14    Complications: No apparent anesthesia complications

## 2015-08-28 NOTE — Anesthesia Preprocedure Evaluation (Addendum)
Anesthesia Evaluation  Patient identified by MRN, date of birth, ID band Patient awake    Reviewed: Allergy & Precautions, NPO status , Patient's Chart, lab work & pertinent test results  Airway Mallampati: II  TM Distance: >3 FB Neck ROM: Full    Dental no notable dental hx. (+) Dental Advisory Given, Caps,    Pulmonary neg pulmonary ROS, Current Smoker,    Pulmonary exam normal breath sounds clear to auscultation       Cardiovascular negative cardio ROS Normal cardiovascular exam Rhythm:Regular Rate:Normal  Hx WPW s/p ablation.   Neuro/Psych PSYCHIATRIC DISORDERS Depression negative neurological ROS     GI/Hepatic Neg liver ROS, GERD  ,  Endo/Other  negative endocrine ROSHypothyroidism   Renal/GU Renal disease     Musculoskeletal  (+) Arthritis ,   Abdominal   Peds  Hematology negative hematology ROS (+)   Anesthesia Other Findings   Reproductive/Obstetrics negative OB ROS                           Lab Results  Component Value Date   WBC 8.7 07/23/2015   HGB 11.9* 07/23/2015   HCT 36.9 07/23/2015   MCV 91.3 07/23/2015   PLT 462* 07/23/2015   Lab Results  Component Value Date   CREATININE 1.10* 07/23/2015   BUN 17 07/23/2015   NA 140 07/23/2015   K 4.6 07/23/2015   CL 112* 07/23/2015   CO2 23 07/23/2015    Anesthesia Physical  Anesthesia Plan  ASA: II  Anesthesia Plan: General   Post-op Pain Management:    Induction: Intravenous  Airway Management Planned: LMA  Additional Equipment:   Intra-op Plan:   Post-operative Plan: Extubation in OR  Informed Consent: I have reviewed the patients History and Physical, chart, labs and discussed the procedure including the risks, benefits and alternatives for the proposed anesthesia with the patient or authorized representative who has indicated his/her understanding and acceptance.   Dental advisory given  Plan  Discussed with: CRNA  Anesthesia Plan Comments:        Anesthesia Quick Evaluation

## 2015-08-29 ENCOUNTER — Encounter (HOSPITAL_BASED_OUTPATIENT_CLINIC_OR_DEPARTMENT_OTHER): Payer: Self-pay | Admitting: Urology

## 2015-08-30 NOTE — Anesthesia Postprocedure Evaluation (Signed)
Anesthesia Post Note  Patient: Jennifer George  Procedure(s) Performed: Procedure(s) (LRB): CYSTOSCOPY WITH RETROGRADE PYELOGRAM (Right) CYSTOSCOPY WITH STENT REMOVAL (Right)  Patient location during evaluation: PACU Anesthesia Type: General Level of consciousness: awake and alert Pain management: pain level controlled Vital Signs Assessment: post-procedure vital signs reviewed and stable Respiratory status: spontaneous breathing Cardiovascular status: blood pressure returned to baseline Anesthetic complications: no    Last Vitals:  Filed Vitals:   08/28/15 1330 08/28/15 1427  BP: 121/68 124/77  Pulse: 83 83  Temp:  36.7 C  Resp: 13 14    Last Pain:  Filed Vitals:   08/28/15 1427  PainSc: 4                  Tiajuana Amass

## 2015-10-26 ENCOUNTER — Encounter: Payer: Self-pay | Admitting: Gastroenterology

## 2015-11-07 ENCOUNTER — Other Ambulatory Visit: Payer: Self-pay | Admitting: Acute Care

## 2015-11-07 DIAGNOSIS — F17210 Nicotine dependence, cigarettes, uncomplicated: Secondary | ICD-10-CM

## 2015-12-22 ENCOUNTER — Other Ambulatory Visit: Payer: Self-pay

## 2015-12-22 DIAGNOSIS — Z1231 Encounter for screening mammogram for malignant neoplasm of breast: Secondary | ICD-10-CM

## 2016-01-30 ENCOUNTER — Ambulatory Visit
Admission: RE | Admit: 2016-01-30 | Discharge: 2016-01-30 | Disposition: A | Discharge status: Home / Self Care | Payer: PRIVATE HEALTH INSURANCE | Source: Ambulatory Visit

## 2016-01-30 ENCOUNTER — Other Ambulatory Visit: Payer: Self-pay | Admitting: Gynecology

## 2016-01-30 DIAGNOSIS — Z1231 Encounter for screening mammogram for malignant neoplasm of breast: Secondary | ICD-10-CM

## 2016-07-16 ENCOUNTER — Telehealth: Payer: Self-pay | Admitting: Acute Care

## 2016-07-16 NOTE — Telephone Encounter (Signed)
I called Jennifer George to schedule her follow-up low-dose lung cancer screening. Initial baseline screening scan done 11/17/2014 was read as a lung RADS 2.nnual screening was due in March 2017, however when attempted to schedule in March 2017 patient preferred to wait until July due to the fact Dr. Diona Fanti was placing an order for a CT of the abdomen, and she wanted to have them both done at the same time. Currently researching whether patient had follow-up scans done and found that it had not been done. I have called the [place] home to attempt to schedule Jennifer George for follow-up low-dose CT screening.there was no answer, I have left a message and our contact information requesting that she call the office to get scheduled for her annual scan. We will await her return call .

## 2016-08-08 ENCOUNTER — Other Ambulatory Visit: Payer: Self-pay | Admitting: Acute Care

## 2016-08-08 DIAGNOSIS — F17210 Nicotine dependence, cigarettes, uncomplicated: Secondary | ICD-10-CM

## 2016-08-13 ENCOUNTER — Other Ambulatory Visit: Payer: Self-pay | Admitting: Acute Care

## 2016-08-16 ENCOUNTER — Ambulatory Visit
Admission: RE | Admit: 2016-08-16 | Discharge: 2016-08-16 | Disposition: A | Discharge status: Home / Self Care | Payer: PRIVATE HEALTH INSURANCE | Source: Ambulatory Visit | Attending: Acute Care | Admitting: Acute Care

## 2016-08-16 DIAGNOSIS — F17210 Nicotine dependence, cigarettes, uncomplicated: Secondary | ICD-10-CM

## 2016-08-20 ENCOUNTER — Telehealth: Payer: Self-pay | Admitting: Acute Care

## 2016-08-20 ENCOUNTER — Other Ambulatory Visit: Payer: Self-pay | Admitting: Acute Care

## 2016-08-20 DIAGNOSIS — F17210 Nicotine dependence, cigarettes, uncomplicated: Secondary | ICD-10-CM

## 2016-08-20 NOTE — Telephone Encounter (Signed)
I attempted to call Ms. Marin Roberts with the results of her low-dose screening CT. There was no answer or option for message on her home line. I then called her cell number which did give option of leaving a message on her personal voicemail. I explained that her scan was read as a lung RADS 2, indicating nodules that are benign in appearance and behavior. I explained the recommendation is for annual screening with low-dose CT every 12 months. I explained that we will schedule her follow-up scan for January 2019. I also explained that I will fax copy the results to her primary care physician Dr. Nada Libman completeness of her care. I left my contact information in number in the event she has any further questions.

## 2016-08-27 ENCOUNTER — Ambulatory Visit: Payer: PRIVATE HEALTH INSURANCE

## 2017-07-09 ENCOUNTER — Encounter: Payer: Self-pay | Admitting: Gastroenterology

## 2017-07-17 ENCOUNTER — Other Ambulatory Visit: Payer: Self-pay | Admitting: Gynecology

## 2017-07-17 DIAGNOSIS — Z1231 Encounter for screening mammogram for malignant neoplasm of breast: Secondary | ICD-10-CM

## 2017-08-25 ENCOUNTER — Ambulatory Visit
Admission: RE | Admit: 2017-08-25 | Discharge: 2017-08-25 | Disposition: A | Discharge status: Home / Self Care | Payer: PRIVATE HEALTH INSURANCE | Source: Ambulatory Visit | Attending: Gynecology | Admitting: Gynecology

## 2017-08-25 DIAGNOSIS — Z1231 Encounter for screening mammogram for malignant neoplasm of breast: Secondary | ICD-10-CM

## 2017-09-03 IMAGING — CT CT ABD-PELV W/O CM
2 of 4 series · 16 of 46 positions shown, 18 images · non-contrast
Comparison: None.

CLINICAL DATA: Right lower quadrant pain

EXAM:
CT ABDOMEN AND PELVIS WITHOUT CONTRAST
TECHNIQUE: Multidetector CT imaging of the abdomen and pelvis was performed
following the standard protocol without IV contrast.

[Series 2: abd/pelvis w/(date) · axial · 0.68mm/px · z∈[-465,-40]mm · 13 of 93 slices shown, 15 images]
[im 4/93  soft-tissue]
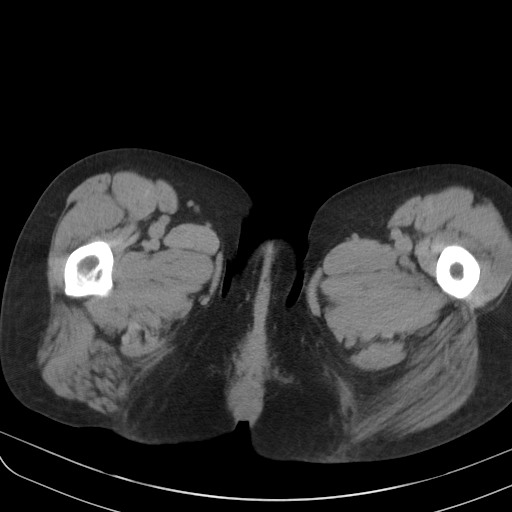
[im 4/93  bone]
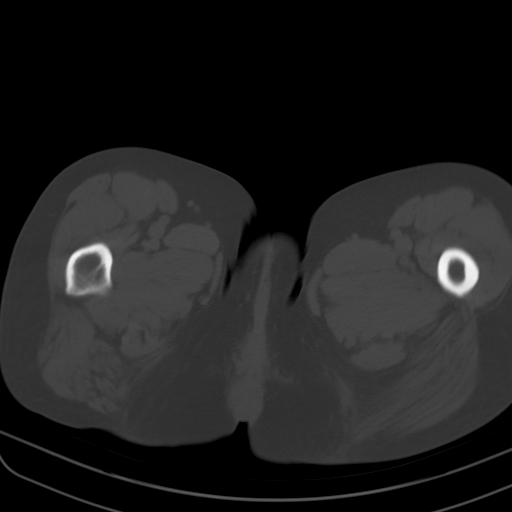
[im 12/93  soft-tissue]
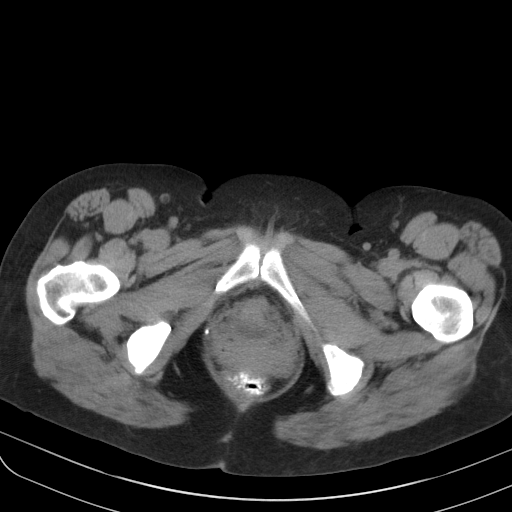
[im 19/93  soft-tissue]
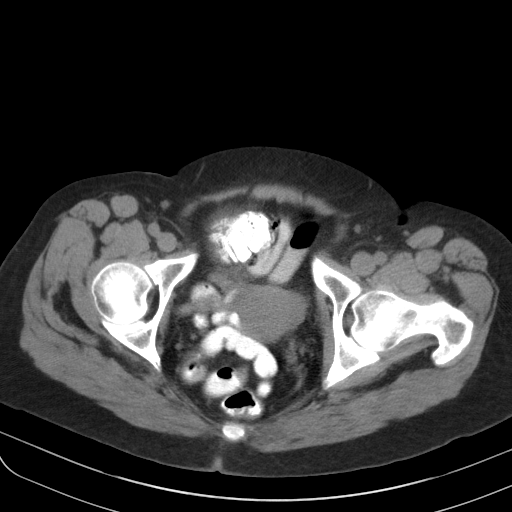
[im 26/93  soft-tissue]
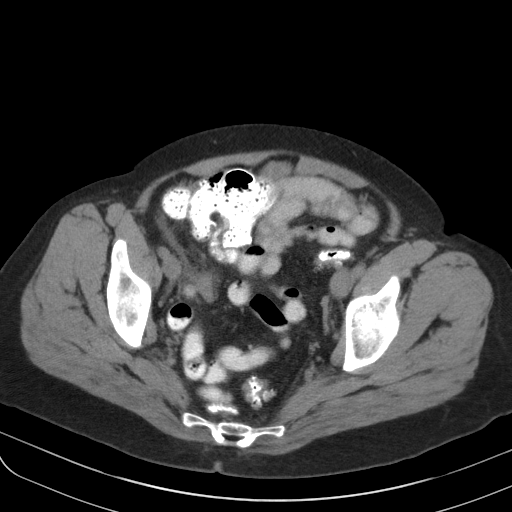
[im 34/93  soft-tissue]
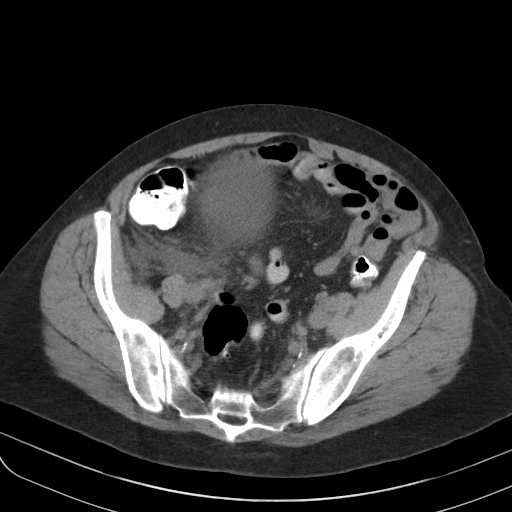
[im 41/93  soft-tissue]
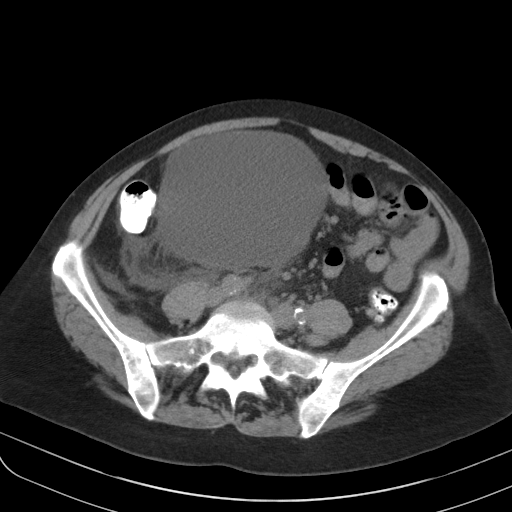
[im 48/93  soft-tissue]
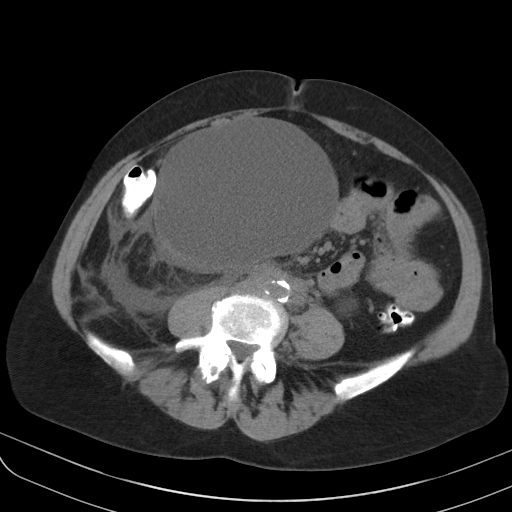
[im 52/93  soft-tissue]
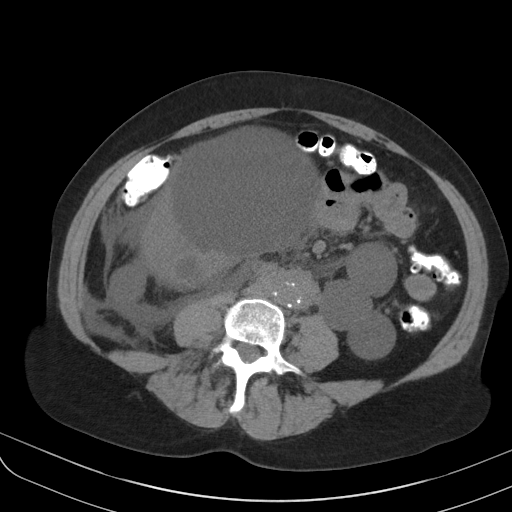
[im 59/93  soft-tissue]
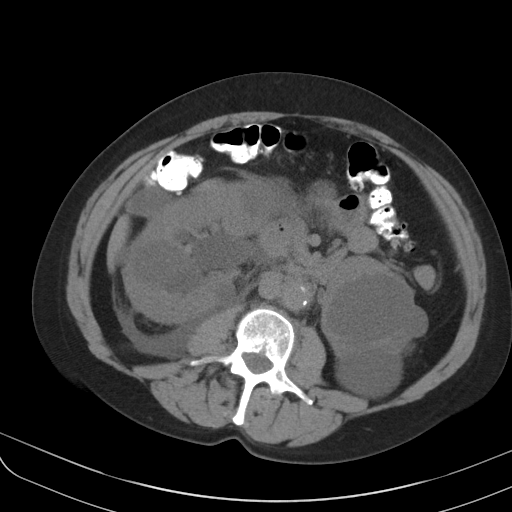
[im 59/93  bone]
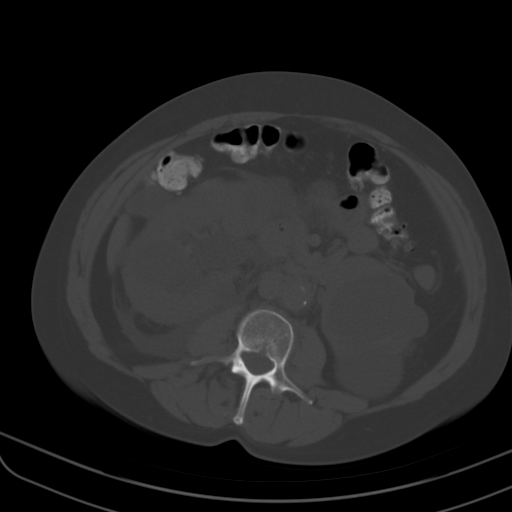
[im 67/93  soft-tissue]
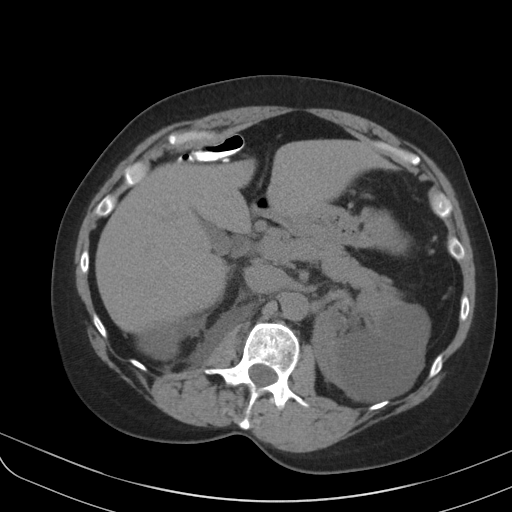
[im 74/93  soft-tissue]
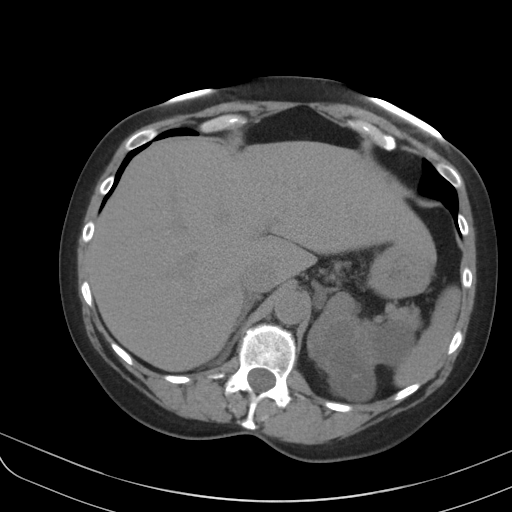
[im 81/93  soft-tissue]
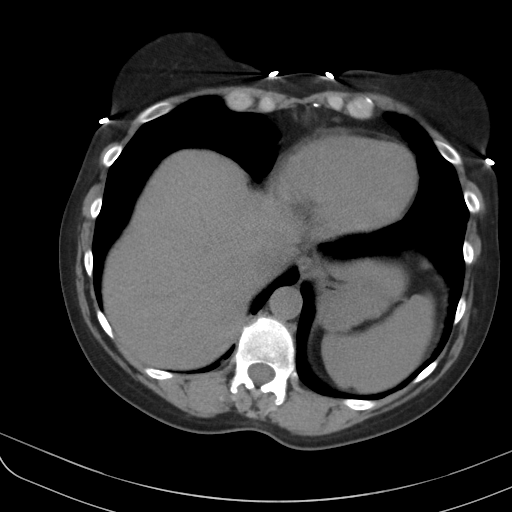
[im 89/93  soft-tissue]
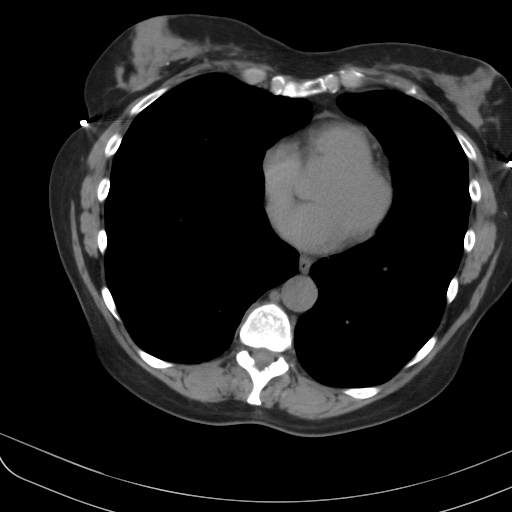

[Series 3: cor · coronal · 0.76mm/px · 3 of 88 slices shown]
[im 30/88  soft-tissue]
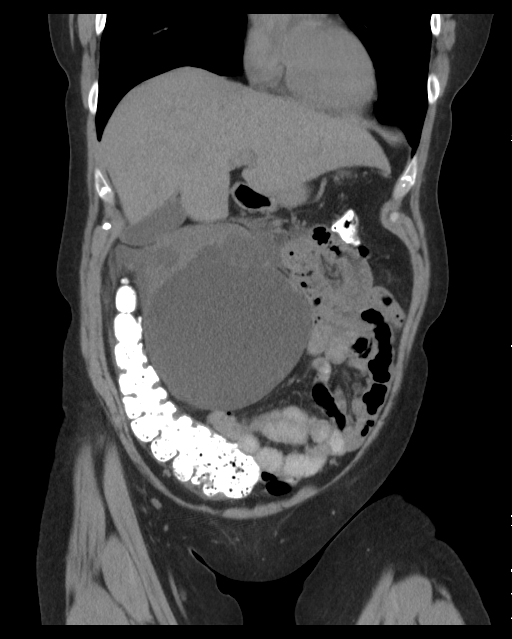
[im 39/88  soft-tissue]
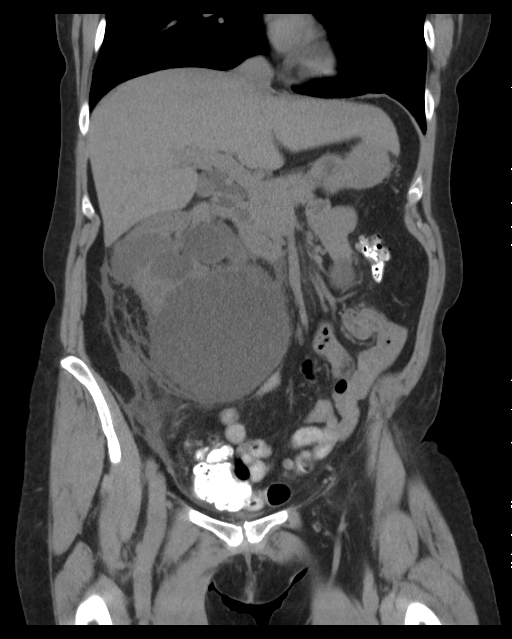
[im 49/88  soft-tissue]
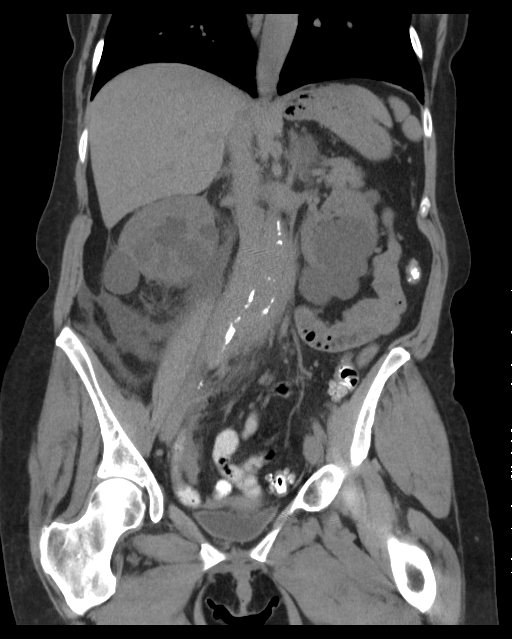

[16 of 46 positions shown; findings below may reference images not displayed]

FINDINGS: Innumerable cysts are seen in both kidneys. There are some rim
calcifications associated with left renal cysts. The largest right
renal cyst is 13 cm. Previously this was reported at 11.5 cm,
therefore it has enlarged.

Moderate right hydronephrosis is present. There is right-sided
perinephric fluid density. No obvious ureteral calculus can be
identified. There is an apparent rind of soft tissue surrounding the
aorta. There is no soft tissue posterior to the aorta. This does
extend to the iliac vasculature region.

Appendix is within normal limits.

5 mm sub solid left lower lobe pulmonary nodule posteriorly is
partially imaged on image 1.

Liver, gallbladder, spleen, pancreas, adrenal glands are within
normal limits.

Diverticulosis throughout much of the colon. Sigmoid is decompressed

Bladder, uterus, and adnexa are unremarkable.

No vertebral compression deformity.
IMPRESSION: Right hydronephrosis and perinephric stranding have developed. These
are secondary findings of ureteral obstruction. No ureteral calculus
can be seen. There is an on enlarging right renal cyst which may be
compressing the right ureter. There is also a soft tissue rind
surrounding the aorta which may represent retroperitoneal fibrosis
or possibly lymphoma. CT abdomen and pelvis with contrast is
recommended to further characterize, and is recommended as the first
next step. The soft tissue rind will be better evaluated by this.
Drainage of the right renal cyst may improve drainage of the right
kidney. Retrograde pyelogram can be performed to evaluate for
obstructing lesion in the right ureter.

5 mm sub solid nodule in the left lower lobe. Initial follow-up by
chest CT without contrast is recommended in 3 months to confirm
persistence. This recommendation follows the consensus statement:
Recommendations for the Management of Subsolid Pulmonary Nodules
Detected at CT: A Statement from the [HOSPITAL] as published

## 2017-11-11 ENCOUNTER — Ambulatory Visit: Payer: PRIVATE HEALTH INSURANCE | Admitting: Sports Medicine

## 2017-11-11 VITALS — BP 126/88 | Ht 68.0 in | Wt 143.0 lb

## 2017-11-11 DIAGNOSIS — M216X2 Other acquired deformities of left foot: Secondary | ICD-10-CM

## 2017-11-11 NOTE — Assessment & Plan Note (Signed)
-   Exam consistent with transverse arch strain.  - Applied metatarsal pad to left orthotic and recommended patient try this as tolerated for about a month. - Recommended icing after play. - Can try arch strap vs compression. - Return in 1 month for re-evaluation. Could consider alternative/softer orthotic if no improvement. Would apply metatarsal pad to duplicate left orthotic patient already has if doing well.

## 2017-11-11 NOTE — Progress Notes (Addendum)
[facility] Progress Note  Subjective:  Jennifer George is a 67 y.o. female with history of right renal cyst s/p stent, tobacco abuse, Morton's neuroma of left foot s/p removal, and previous history of plantar fasciitis who presents for left foot pain. She describes pain as sharp like a knife over the lateral aspect of her dorsal midfoot and more like a soreness with occasional burning over rest of midfoot. Pain seems to have been brought on by indoor tennis this winter on hard courts, playing 1-2 times a week. She denies any specific injury to the area. She takes diclofenac in the morning and sometimes at night as well if pain is worse. She takes 1000 mg tylenol from time to time with a little improvement. She has an arch strap at home that seems to cause numbness/tingling when she wears it. She has not taken rest from tennis despite pain. She has tried a new pair of tennis shoes with a wider toe box that seems to help some. She has been rotating her orthotics to find what combination is most comfortable. ROS: No fevers, no falls  Allergies  Allergen Reactions  . Tape Other (See Comments)    Skin red irritation    Social History   Tobacco Use  . Smoking status: Current Every Day Smoker    Packs/day: 0.80    Years: 46.00    Pack years: 36.80    Types: Cigarettes  . Smokeless tobacco: Never Used  Substance Use Topics  . Alcohol use: Yes    Comment: RARE    Objective: Blood pressure 126/88, height 5\' 8"  (1.727 m), weight 143 lb (64.9 kg). Body mass index is 21.74 kg/m. Constitutional: Athletic. pleasant female in NAD Musculoskeletal: Widening of forefoot on the left with decreased landmarks compared to right. Outward rotation of fifth toes bilaterally. Loss of transverse arch on left. Heels well aligned. No reproducible TTP over metatarsals bilaterally. Normal dorsi and plantar flexion bilaterally. Neurological: AOx3, no focal deficits. Peripheral sensation of LEs  intact. Gait normal.  Skin: Faint blueness of toes over left forefoot.  Psychiatric: Normal mood and affect.  Vitals reviewed  Assessment/Plan: Loss of transverse plantar arch, left - Exam consistent with transverse arch strain.  - Applied metatarsal pad to left orthotic and recommended patient try this as tolerated for about a month. - Recommended icing after play. - Can try arch strap vs compression. - Return in 1 month for re-evaluation. Could consider alternative/softer orthotic if no improvement. Would apply metatarsal pad to duplicate left orthotic patient already has if doing well.   Follow-up in 1 month.  Olene Floss, MD [place], PGY-3  Her current orthotics are rubber based with less forefoot cushion.  Modified today with New MT pad added arch strap.  We can move to different form of cushion if not enough relief.  I observed and examined the patient with the resident and agree with assessment and plan.  Note reviewed and modified by me. Stefanie Libel, MD

## 2017-11-20 ENCOUNTER — Encounter: Payer: Self-pay | Admitting: Sports Medicine

## 2017-11-20 ENCOUNTER — Ambulatory Visit: Payer: PRIVATE HEALTH INSURANCE | Admitting: Sports Medicine

## 2017-11-20 DIAGNOSIS — M216X2 Other acquired deformities of left foot: Secondary | ICD-10-CM | POA: Diagnosis not present

## 2017-11-20 NOTE — Progress Notes (Signed)
   HPI  CC: Left foot pain follow-up Patient is here for follow-up regarding her left-sided foot pain.  She states that she is still having discomfort despite the changes made in her prior orthotics (from a different clinic).  She endorses pain across the medial aspect of her midfoot and ankle as well as across her longitudinal arch.  Patient plays a significant amount of tennis and states that her foot pain is a limiting factor in her performance.   ROS She denies any new injury, trauma, or event since last visit.  No new weakness, numbness, or paresthesias.  Medications/Interventions Tried: orthotics +/- MT pads  Past HX WPW Renal issues Hypothyroidism All stable at persent  See HPI and/or previous note for associated ROS.  Objective: BP 110/80   Ht 5\' 8"  (1.727 m)   Wt 143 lb (64.9 kg)   BMI 21.74 kg/m  Gen: NAD, well groomed, a/o x3, normal affect.  CV: Well-perfused. Warm.  Resp: Non-labored.  Neuro: Sensation intact throughout. No gross coordination deficits.  Gait: Nonpathologic posture, unremarkable stride without signs of limp or balance issues. Ankle/Foot: TTP noted at the left medial midfoot, dorsal distal metatarsals, and longitudinal arch. Widening of forefoot on the left with decreased landmarks compared to right. Outward rotation of fifth toes bilaterally. Loss of transverse arch on left. Heels well aligned. Normal dorsi and plantar flexion bilaterally.  Custom Orthotics: - Patient was fitted for a standard, cushioned, semi-rigid orthotic. - Orthotic was heated and afterward the patient stood on the orthotic blank positioned on the orthotic stand. - The patient was positioned in subtalar neutral position and 10 degrees of ankle dorsiflexion in a weight bearing stance. - After completion of molding, a stable base was applied to the orthotic blank. - The blank was ground to a stable position for weight bearing. - Size: 8 - Base: Blue EVA - Additional Posting and  Padding: none - The patient ambulated in these, and they were very comfortable.    Assessment and plan:  Loss of transverse plantar arch, left Patient is here for follow-up regarding her left-sided foot pain/metatarsalgia.  Changes to her prior orthotics have not provided any benefit.  We decided to move forward with new orthotics using the more flexible cushioned material we have in our office. -Orthotics made endorsed with comfort per patient. -Patient is to follow-up in 4 weeks for reassessment.   I spent 30 minutes with this patient. Over 50% of visit was spent in counseling and coordination of care for problems with metatarsalgia.   Elberta Leatherwood, MD,MS [facility] Fellow 11/20/2017 7:01 PM   I observed and examined the patient with the [facility] fellow and agree with assessment and plan.  Note reviewed and modified by me. Stefanie Libel, MD

## 2017-11-20 NOTE — Assessment & Plan Note (Signed)
Patient is here for follow-up regarding her left-sided foot pain/metatarsalgia.  Changes to her prior orthotics have not provided any benefit.  We decided to move forward with new orthotics using the more flexible cushioned material we have in our office. -Orthotics made endorsed with comfort per patient. -Patient is to follow-up in 4 weeks for reassessment.

## 2017-12-11 ENCOUNTER — Ambulatory Visit: Payer: Self-pay | Admitting: Podiatry

## 2017-12-12 ENCOUNTER — Encounter: Payer: Self-pay | Admitting: Podiatry

## 2017-12-12 ENCOUNTER — Ambulatory Visit: Payer: PRIVATE HEALTH INSURANCE | Admitting: Podiatry

## 2017-12-12 DIAGNOSIS — L603 Nail dystrophy: Secondary | ICD-10-CM | POA: Diagnosis not present

## 2017-12-12 MED ORDER — UREA 40 % EX OINT: TOPICAL_OINTMENT | CUTANEOUS | 0 refills | Status: DC | PRN

## 2017-12-12 NOTE — Patient Instructions (Signed)

## 2017-12-15 NOTE — Progress Notes (Signed)
Subjective:   Patient ID: Jennifer George, female   DOB: 67 y.o.   MRN: [id number]   HPI Patient presents the office today for concerns of nail thickening and pain to the left big toenail which is been ongoing for some time.  She states that she has had fungus to the area for some time.  She is been using oregano oil which is been helpful.  She states the top of the nail towards the tip will get painful to get pressure in shoes.  She will continue seeing her toenails and she keeps a piece of cotton under the toenail which helps.  She does not feel it is the ingrown toenails causing pain.  She denies any redness or drainage or any swelling.  She does play a lot of tennis.  She is also under the care  Dr. Oneida Alar with sports medicine for tendinitis.  This is getting much better.  She is also been stretching exercises.  No recent injury that she can recall.  She has no other concerns.   Review of Systems  All other systems reviewed and are negative.  Past Medical History:  Diagnosis Date  . Acute kidney injury ([place])    07-20-2015  . Arthritis   . Depression   . Hypothyroidism   . Mild acid reflux   . Multicystic kidney    bilateral-- congenital  (right cyst drainage x2 by radiologist-- 07-20-2015 979ml and 07-22-2015  . Pulmonary nodule    left side  per CT 07-19-2015  . Retroperitoneal fibrosis   . Wears contact lenses   . WPW (Wolff-Parkinson-White syndrome) last visit cardiologist dr Caryl Comes 2010--  per pt no issues or symptoms since   WITH LEFT POSTEROLATERAL ACCESORY PATHWAY-- S/P  ABLATION  11-23-2008    Past Surgical History:  Procedure Laterality Date  . CARDIAC ELECTROPHYSIOLOGY STUDY AND ABLATION  11-23-2008   DR Caryl Comes   Ablation left posterolateral accessory pathway  . CYSTOSCOPY W/ RETROGRADES Right 08/28/2015   Procedure: CYSTOSCOPY WITH RETROGRADE PYELOGRAM;  Surgeon: Franchot Gallo, MD;  Location: [facility];  Service: Urology;  Laterality: Right;  .  CYSTOSCOPY W/ URETERAL STENT PLACEMENT Right 07/28/2015   Procedure: CYSTOSCOPY WITH RETROGRADE PYELOGRAM/URETERAL STENT PLACEMENT;  Surgeon: Franchot Gallo, MD;  Location: [facility];  Service: Urology;  Laterality: Right;  . CYSTOSCOPY W/ URETERAL STENT REMOVAL Right 08/28/2015   Procedure: CYSTOSCOPY WITH STENT REMOVAL;  Surgeon: Franchot Gallo, MD;  Location: [facility];  Service: Urology;  Laterality: Right;  . [place]  . EXCISION MORTON'S NEUROMA  2000  . POSTERIOR AND ENTEROCELE REPAIR/  CARDINAL UTEROSACRAL COLPOSUSPENSION  06-29-2009  . TRANSTHORACIC ECHOCARDIOGRAM  10-05-2008   normal LVF, ef 55-60%/  mild AV sclerosis and calcification without stenosis/  trivial MR and TR     Current Outpatient Medications:  .  acetaminophen (TYLENOL) 500 MG tablet, Take 1,000 mg by mouth every 6 (six) hours as needed., Disp: , Rfl:  .  aspirin 81 MG tablet, Take 81 mg by mouth daily., Disp: , Rfl:  .  atorvastatin (LIPITOR) 20 MG tablet, Take 20 mg by mouth every morning. , Disp: , Rfl: 11 .  bifidobacterium infantis (ALIGN) capsule, Take 1 capsule by mouth every morning. , Disp: , Rfl:  .  cholecalciferol (VITAMIN D) 1000 UNITS tablet, Take 1,000 Units by mouth daily., Disp: , Rfl:  .  diclofenac (VOLTAREN) 75 MG EC tablet, Take 75 mg  by mouth daily. , Disp: , Rfl:  .  estradiol (VIVELLE-DOT) 0.05 MG/24HR patch, Place 1 patch onto the skin 2 (two) times a week. Tues and Saturday., Disp: , Rfl:  .  fexofenadine (ALLEGRA) 180 MG tablet, Take 180 mg by mouth every morning. , Disp: , Rfl:  .  levothyroxine (SYNTHROID, LEVOTHROID) 88 MCG tablet, Take 88 mcg by mouth daily before breakfast. ON Sunday'S TAKES TWO TABLETS, Disp: , Rfl:  .  Polyethylene Glycol 3350 (MIRALAX PO), Take by mouth every morning. , Disp: , Rfl:  .  progesterone (PROMETRIUM) 200 MG capsule, TAKE 1 CAPSULE(S) EVERY DAY BY ORAL ROUTE FOR 14 NIGHTS EVERY OTHER  MONTH, Disp: , Rfl: 6 .  pyridOXINE (VITAMIN B-6) 100 MG tablet, Take 100 mg by mouth daily., Disp: , Rfl:  .  traZODone (DESYREL) 100 MG tablet, Take 200-300 mg by mouth at bedtime. , Disp: , Rfl: 5 .  urea (GORDONS UREA) 40 % ointment, Apply topically as needed., Disp: 30 g, Rfl: 0 .  vitamin B-12 (CYANOCOBALAMIN) 1000 MCG tablet, Take 1,000 mcg by mouth daily., Disp: , Rfl:  .  vitamin C (ASCORBIC ACID) 500 MG tablet, Take 1,000 mg by mouth daily., Disp: , Rfl:   Allergies  Allergen Reactions  . Tape Other (See Comments)    Skin red irritation    Social History   Socioeconomic History  . Marital status: Married    Spouse name: Not on file  . Number of children: 3  . Years of education: Not on file  . Highest education level: Not on file  Occupational History  . Occupation: Futures trader  Social Needs  . Financial resource strain: Not on file  . Food insecurity:    Worry: Not on file    Inability: Not on file  . Transportation needs:    Medical: Not on file    Non-medical: Not on file  Tobacco Use  . Smoking status: Current Every Day Smoker    Packs/day: 0.80    Years: 46.00    Pack years: 36.80    Types: Cigarettes  . Smokeless tobacco: Never Used  Substance and Sexual Activity  . Alcohol use: Yes    Comment: RARE  . Drug use: No  . Sexual activity: Not on file  Lifestyle  . Physical activity:    Days per week: Not on file    Minutes per session: Not on file  . Stress: Not on file  Relationships  . Social connections:    Talks on phone: Not on file    Gets together: Not on file    Attends religious service: Not on file    Active member of club or organization: Not on file    Attends meetings of clubs or organizations: Not on file    Relationship status: Not on file  . Intimate partner violence:    Fear of current or ex partner: Not on file    Emotionally abused: Not on file    Physically abused: Not on file    Forced sexual activity: Not on file   Other Topics Concern  . Not on file  Social History Narrative  . Not on file         Objective:  Physical Exam  General: AAO x3, NAD  Dermatological: Left hallux toenail is hypertrophic, dystrophic, discolored with yellow-brown discoloration.  Mild incurvation of the nail corners there is no tenderness palpation of this area.  There is no edema, erythema, drainage or pus  or any clinical signs of infection.  The only area of tenderness is the dorsal central portion of the nail.  I think more of her discomfort is coming from the nail thickening as well as ingrowing of the toenail.  Vascular: Dorsalis Pedis artery and Posterior Tibial artery pedal pulses are 2/4 bilateral with immedate capillary fill time.  There is no pain with calf compression, swelling, warmth, erythema.   Neruologic: Grossly intact via light touch bilateral. Protective threshold with Semmes Wienstein monofilament intact to all pedal sites bilateral.   Musculoskeletal: No gross boney pedal deformities bilateral. No pain, crepitus, or limitation noted with foot and ankle range of motion bilateral. Muscular strength 5/5 in all groups tested bilateral.  Gait: Unassisted, Nonantalgic.       Assessment:    Symptomatic onychodystrophy left hallux toenail    Plan:  -Treatment options discussed including all alternatives, risks, and complications -Etiology of symptoms were discussed -This I discussed with nail removal but she wishes to hold off on this.We discussed various treatment options to help from the toenail.  We discussed various medication options and will restart with the urea cream.  I ordered urea 40%. -I did lightly debride some of the toenails today but she does not want much taken off.  Watch for signs or symptoms of infection. -Stretching exercises discussed for her tendinitis. -Follow-up if symptoms not improving next couple weeks or sooner if there is any issues.  Monitor for any signs or symptoms of  infection.  Trula Slade DPM

## 2018-01-23 ENCOUNTER — Encounter: Payer: Self-pay | Admitting: Acute Care

## 2018-09-15 ENCOUNTER — Other Ambulatory Visit: Payer: Self-pay | Admitting: Gynecology

## 2018-09-15 DIAGNOSIS — Z1231 Encounter for screening mammogram for malignant neoplasm of breast: Secondary | ICD-10-CM

## 2018-10-15 ENCOUNTER — Ambulatory Visit
Admission: RE | Admit: 2018-10-15 | Discharge: 2018-10-15 | Disposition: A | Discharge status: Home / Self Care | Payer: Medicare HMO | Source: Ambulatory Visit | Attending: Gynecology | Admitting: Gynecology

## 2018-10-15 DIAGNOSIS — Z1231 Encounter for screening mammogram for malignant neoplasm of breast: Secondary | ICD-10-CM | POA: Diagnosis not present

## 2018-11-26 DIAGNOSIS — Z0000 Encounter for general adult medical examination without abnormal findings: Secondary | ICD-10-CM | POA: Diagnosis not present

## 2018-11-26 DIAGNOSIS — E042 Nontoxic multinodular goiter: Secondary | ICD-10-CM | POA: Diagnosis not present

## 2018-12-03 DIAGNOSIS — E039 Hypothyroidism, unspecified: Secondary | ICD-10-CM | POA: Diagnosis not present

## 2018-12-03 DIAGNOSIS — J302 Other seasonal allergic rhinitis: Secondary | ICD-10-CM | POA: Diagnosis not present

## 2018-12-03 DIAGNOSIS — Z720 Tobacco use: Secondary | ICD-10-CM | POA: Diagnosis not present

## 2018-12-03 DIAGNOSIS — Q613 Polycystic kidney, unspecified: Secondary | ICD-10-CM | POA: Diagnosis not present

## 2018-12-03 DIAGNOSIS — E785 Hyperlipidemia, unspecified: Secondary | ICD-10-CM | POA: Diagnosis not present

## 2018-12-03 DIAGNOSIS — Z0000 Encounter for general adult medical examination without abnormal findings: Secondary | ICD-10-CM | POA: Diagnosis not present

## 2018-12-03 DIAGNOSIS — L600 Ingrowing nail: Secondary | ICD-10-CM | POA: Diagnosis not present

## 2018-12-03 DIAGNOSIS — M1990 Unspecified osteoarthritis, unspecified site: Secondary | ICD-10-CM | POA: Diagnosis not present

## 2018-12-03 DIAGNOSIS — I456 Pre-excitation syndrome: Secondary | ICD-10-CM | POA: Diagnosis not present

## 2018-12-03 DIAGNOSIS — E042 Nontoxic multinodular goiter: Secondary | ICD-10-CM | POA: Diagnosis not present

## 2019-01-21 DIAGNOSIS — H11122 Conjunctival concretions, left eye: Secondary | ICD-10-CM | POA: Diagnosis not present

## 2019-01-21 DIAGNOSIS — H52223 Regular astigmatism, bilateral: Secondary | ICD-10-CM | POA: Diagnosis not present

## 2019-01-21 DIAGNOSIS — H5203 Hypermetropia, bilateral: Secondary | ICD-10-CM | POA: Diagnosis not present

## 2019-01-21 DIAGNOSIS — H04123 Dry eye syndrome of bilateral lacrimal glands: Secondary | ICD-10-CM | POA: Diagnosis not present

## 2019-01-21 DIAGNOSIS — H524 Presbyopia: Secondary | ICD-10-CM | POA: Diagnosis not present

## 2019-01-27 DIAGNOSIS — Z0100 Encounter for examination of eyes and vision without abnormal findings: Secondary | ICD-10-CM | POA: Diagnosis not present

## 2019-02-01 DIAGNOSIS — Z1211 Encounter for screening for malignant neoplasm of colon: Secondary | ICD-10-CM | POA: Diagnosis not present

## 2019-02-01 DIAGNOSIS — Z1212 Encounter for screening for malignant neoplasm of rectum: Secondary | ICD-10-CM | POA: Diagnosis not present

## 2019-02-02 DIAGNOSIS — R69 Illness, unspecified: Secondary | ICD-10-CM | POA: Diagnosis not present

## 2019-04-14 DIAGNOSIS — Z780 Asymptomatic menopausal state: Secondary | ICD-10-CM | POA: Diagnosis not present

## 2019-04-14 DIAGNOSIS — Z79890 Hormone replacement therapy (postmenopausal): Secondary | ICD-10-CM | POA: Diagnosis not present

## 2019-04-14 DIAGNOSIS — Z01411 Encounter for gynecological examination (general) (routine) with abnormal findings: Secondary | ICD-10-CM | POA: Diagnosis not present

## 2019-04-14 DIAGNOSIS — Z6822 Body mass index (BMI) 22.0-22.9, adult: Secondary | ICD-10-CM | POA: Diagnosis not present

## 2019-05-14 DIAGNOSIS — R69 Illness, unspecified: Secondary | ICD-10-CM | POA: Diagnosis not present

## 2019-08-09 DIAGNOSIS — R69 Illness, unspecified: Secondary | ICD-10-CM | POA: Diagnosis not present

## 2019-09-10 ENCOUNTER — Ambulatory Visit: Payer: Medicare HMO | Attending: Internal Medicine

## 2019-09-10 DIAGNOSIS — Z23 Encounter for immunization: Secondary | ICD-10-CM | POA: Insufficient documentation

## 2019-09-10 NOTE — Progress Notes (Signed)
   Covid-19 Vaccination Clinic  Name:  ALY CALIFF    MRN: [id number] DOB: 12-09-1950  09/10/2019  Ms. Santone was observed post Covid-19 immunization for 15 minutes without incidence. She was provided with Vaccine Information Sheet and instruction to access the V-Safe system.   Ms. Frew was instructed to call 911 with any severe reactions post vaccine: Marland Kitchen Difficulty breathing  . Swelling of your face and throat  . A fast heartbeat  . A bad rash all over your body  . Dizziness and weakness    Immunizations Administered    Name Date Dose VIS Date Route   Pfizer COVID-19 Vaccine 09/10/2019  9:37 AM 0.3 mL 07/30/2019 Intramuscular   Manufacturer: [place]   Lot: [id number]   [place]: [id number]

## 2019-09-30 ENCOUNTER — Ambulatory Visit: Payer: Medicare HMO | Attending: Internal Medicine

## 2019-09-30 DIAGNOSIS — Z23 Encounter for immunization: Secondary | ICD-10-CM | POA: Insufficient documentation

## 2019-09-30 NOTE — Progress Notes (Signed)
   Covid-19 Vaccination Clinic  Name:  Jennifer George    MRN: [id number] DOB: [birth year]  09/30/2019  Jennifer George was observed post Covid-19 immunization for 15 minutes without incidence. She was provided with Vaccine Information Sheet and instruction to access the V-Safe system.   Jennifer George was instructed to call 911 with any severe reactions post vaccine: Marland Kitchen Difficulty breathing  . Swelling of your face and throat  . A fast heartbeat  . A bad rash all over your body  . Dizziness and weakness    Immunizations Administered    Name Date Dose VIS Date Route   Pfizer COVID-19 Vaccine 09/30/2019  2:40 AM 0.3 mL 07/30/2019 Intramuscular   Manufacturer: [place]   Lot: [id number]   [place]: [id number]

## 2019-12-02 ENCOUNTER — Other Ambulatory Visit: Payer: Self-pay | Admitting: Gynecology

## 2019-12-02 DIAGNOSIS — Z0000 Encounter for general adult medical examination without abnormal findings: Secondary | ICD-10-CM | POA: Diagnosis not present

## 2019-12-02 DIAGNOSIS — Z1231 Encounter for screening mammogram for malignant neoplasm of breast: Secondary | ICD-10-CM

## 2019-12-02 DIAGNOSIS — E7849 Other hyperlipidemia: Secondary | ICD-10-CM | POA: Diagnosis not present

## 2019-12-02 DIAGNOSIS — E038 Other specified hypothyroidism: Secondary | ICD-10-CM | POA: Diagnosis not present

## 2019-12-06 DIAGNOSIS — M1990 Unspecified osteoarthritis, unspecified site: Secondary | ICD-10-CM | POA: Diagnosis not present

## 2019-12-06 DIAGNOSIS — R82998 Other abnormal findings in urine: Secondary | ICD-10-CM | POA: Diagnosis not present

## 2019-12-06 DIAGNOSIS — Z0000 Encounter for general adult medical examination without abnormal findings: Secondary | ICD-10-CM | POA: Diagnosis not present

## 2019-12-06 DIAGNOSIS — Q613 Polycystic kidney, unspecified: Secondary | ICD-10-CM | POA: Diagnosis not present

## 2019-12-06 DIAGNOSIS — I456 Pre-excitation syndrome: Secondary | ICD-10-CM | POA: Diagnosis not present

## 2019-12-06 DIAGNOSIS — E039 Hypothyroidism, unspecified: Secondary | ICD-10-CM | POA: Diagnosis not present

## 2019-12-06 DIAGNOSIS — J449 Chronic obstructive pulmonary disease, unspecified: Secondary | ICD-10-CM | POA: Diagnosis not present

## 2019-12-06 DIAGNOSIS — E785 Hyperlipidemia, unspecified: Secondary | ICD-10-CM | POA: Diagnosis not present

## 2019-12-06 DIAGNOSIS — E042 Nontoxic multinodular goiter: Secondary | ICD-10-CM | POA: Diagnosis not present

## 2019-12-06 DIAGNOSIS — J302 Other seasonal allergic rhinitis: Secondary | ICD-10-CM | POA: Diagnosis not present

## 2019-12-06 DIAGNOSIS — Z720 Tobacco use: Secondary | ICD-10-CM | POA: Diagnosis not present

## 2019-12-07 ENCOUNTER — Ambulatory Visit
Admission: RE | Admit: 2019-12-07 | Discharge: 2019-12-07 | Disposition: A | Discharge status: Home / Self Care | Payer: Medicare HMO | Source: Ambulatory Visit | Attending: Gynecology | Admitting: Gynecology

## 2019-12-07 ENCOUNTER — Other Ambulatory Visit: Payer: Self-pay

## 2019-12-07 DIAGNOSIS — Z1231 Encounter for screening mammogram for malignant neoplasm of breast: Secondary | ICD-10-CM

## 2019-12-08 DIAGNOSIS — Z1212 Encounter for screening for malignant neoplasm of rectum: Secondary | ICD-10-CM | POA: Diagnosis not present

## 2019-12-16 ENCOUNTER — Telehealth: Payer: Medicare HMO | Admitting: Emergency Medicine

## 2019-12-16 DIAGNOSIS — M545 Low back pain, unspecified: Secondary | ICD-10-CM

## 2019-12-16 MED ORDER — METHOCARBAMOL 500 MG PO TABS: 500.0000 mg | ORAL_TABLET | Freq: Two times a day (BID) | ORAL | 0 refills | Status: DC | PRN

## 2019-12-16 NOTE — Progress Notes (Signed)
We are sorry that you are not feeling well.  Here is how we plan to help!  Based on what you have shared with me it looks like you mostly have acute back pain.  Acute back pain is defined as musculoskeletal pain that can resolve in 1-3 weeks with conservative treatment. You may take tylenol or Ibuprofen. I have prescribed Baclofen 10 mg 1 tablet up to 2 x a day for muscle spasm.  TRAZODONE AND BACLOFEN CAUSE SEDATION. PLEASE START LOW WITH EITHER 1/2 A TABLET 2 TIMES A DAY, OR 1 TABLET DAILY. BE SURE TO GIVE YOURSELF AT LEAST 6 HOURS BETWEEN YOUR LAST DOSE OF BACLOFEN AND YOUR NIGHTLY TRAZODONE.    Some patients experience stomach irritation or in increased heartburn with anti-inflammatory drugs.  Please keep in mind that muscle relaxer's can cause fatigue and should not be taken while at work or driving.  Back pain is very common.  The pain often gets better over time.  The cause of back pain is usually not dangerous.  Most people can learn to manage their back pain on their own.  Home Care  Stay active.  Start with short walks on flat ground if you can.  Try to walk farther each day.  Do not sit, drive or stand in one place for more than 30 minutes.  Do not stay in bed.  Do not avoid exercise or work.  Activity can help your back heal faster.  Be careful when you bend or lift an object.  Bend at your knees, keep the object close to you, and do not twist.  Sleep on a firm mattress.  Lie on your side, and bend your knees.  If you lie on your back, put a pillow under your knees.  Only take medicines as told by your doctor.  Put ice on the injured area.  Put ice in a plastic bag  Place a towel between your skin and the bag  Leave the ice on for 15-20 minutes, 3-4 times a day for the first 2-3 days. 210 After that, you can switch between ice and heat packs.  Ask your doctor about back exercises or massage.  Avoid feeling anxious or stressed.  Find good ways to deal with stress, such  as exercise.  Get Help Right Way If:  Your pain does not go away with rest or medicine.  Your pain does not go away in 1 week.  You have new problems.  You do not feel well.  The pain spreads into your legs.  You cannot control when you poop (bowel movement) or pee (urinate)  You feel sick to your stomach (nauseous) or throw up (vomit)  You have belly (abdominal) pain.  You feel like you may pass out (faint).  If you develop a fever.  Make Sure you:  Understand these instructions.  Will watch your condition  Will get help right away if you are not doing well or get worse.  Your e-visit answers were reviewed by a board certified advanced clinical practitioner to complete your personal care plan.  Depending on the condition, your plan could have included both over the counter or prescription medications.  If there is a problem please reply  once you have received a response from your provider.  Your safety is important to [place].  If you have drug allergies check your prescription carefully.    You can use MyChart to ask questions about today's visit, request a non-urgent call back, or ask  for a work or school excuse for 24 hours related to this e-Visit. If it has been greater than 24 hours you will need to follow up with your provider, or enter a new e-Visit to address those concerns.  You will get an e-mail in the next two days asking about your experience.  I hope that your e-visit has been valuable and will speed your recovery. Thank you for using e-visits.  Greater than 5 but less than 10 minutes spent researching, coordinating, and implementing care for this patient today

## 2020-01-11 DIAGNOSIS — R69 Illness, unspecified: Secondary | ICD-10-CM | POA: Diagnosis not present

## 2020-02-17 DIAGNOSIS — R69 Illness, unspecified: Secondary | ICD-10-CM | POA: Diagnosis not present

## 2020-02-20 DIAGNOSIS — R438 Other disturbances of smell and taste: Secondary | ICD-10-CM | POA: Diagnosis not present

## 2020-02-20 DIAGNOSIS — R432 Parageusia: Secondary | ICD-10-CM | POA: Diagnosis not present

## 2020-02-20 DIAGNOSIS — Z20822 Contact with and (suspected) exposure to covid-19: Secondary | ICD-10-CM | POA: Diagnosis not present

## 2020-02-20 DIAGNOSIS — R05 Cough: Secondary | ICD-10-CM | POA: Diagnosis not present

## 2020-02-20 DIAGNOSIS — H6981 Other specified disorders of Eustachian tube, right ear: Secondary | ICD-10-CM | POA: Diagnosis not present

## 2020-02-20 DIAGNOSIS — J0100 Acute maxillary sinusitis, unspecified: Secondary | ICD-10-CM | POA: Diagnosis not present

## 2020-02-20 DIAGNOSIS — H9201 Otalgia, right ear: Secondary | ICD-10-CM | POA: Diagnosis not present

## 2020-02-28 DIAGNOSIS — R69 Illness, unspecified: Secondary | ICD-10-CM | POA: Diagnosis not present

## 2020-03-09 DIAGNOSIS — R69 Illness, unspecified: Secondary | ICD-10-CM | POA: Diagnosis not present

## 2020-03-16 DIAGNOSIS — R69 Illness, unspecified: Secondary | ICD-10-CM | POA: Diagnosis not present

## 2020-03-23 DIAGNOSIS — R69 Illness, unspecified: Secondary | ICD-10-CM | POA: Diagnosis not present

## 2020-03-29 DIAGNOSIS — H524 Presbyopia: Secondary | ICD-10-CM | POA: Diagnosis not present

## 2020-04-20 DIAGNOSIS — Z79890 Hormone replacement therapy (postmenopausal): Secondary | ICD-10-CM | POA: Diagnosis not present

## 2020-04-20 DIAGNOSIS — Z6822 Body mass index (BMI) 22.0-22.9, adult: Secondary | ICD-10-CM | POA: Diagnosis not present

## 2020-04-20 DIAGNOSIS — R69 Illness, unspecified: Secondary | ICD-10-CM | POA: Diagnosis not present

## 2020-04-20 DIAGNOSIS — Z01419 Encounter for gynecological examination (general) (routine) without abnormal findings: Secondary | ICD-10-CM | POA: Diagnosis not present

## 2020-04-20 DIAGNOSIS — Z01411 Encounter for gynecological examination (general) (routine) with abnormal findings: Secondary | ICD-10-CM | POA: Diagnosis not present

## 2020-04-20 DIAGNOSIS — Z780 Asymptomatic menopausal state: Secondary | ICD-10-CM | POA: Diagnosis not present

## 2020-05-13 DIAGNOSIS — R69 Illness, unspecified: Secondary | ICD-10-CM | POA: Diagnosis not present

## 2020-07-05 ENCOUNTER — Ambulatory Visit (INDEPENDENT_AMBULATORY_CARE_PROVIDER_SITE_OTHER): Payer: Medicare HMO

## 2020-07-05 ENCOUNTER — Ambulatory Visit: Payer: Medicare HMO | Admitting: Podiatry

## 2020-07-05 ENCOUNTER — Other Ambulatory Visit: Payer: Self-pay

## 2020-07-05 DIAGNOSIS — M792 Neuralgia and neuritis, unspecified: Secondary | ICD-10-CM

## 2020-07-05 DIAGNOSIS — M19079 Primary osteoarthritis, unspecified ankle and foot: Secondary | ICD-10-CM | POA: Diagnosis not present

## 2020-07-05 DIAGNOSIS — M778 Other enthesopathies, not elsewhere classified: Secondary | ICD-10-CM

## 2020-07-11 ENCOUNTER — Encounter: Payer: Self-pay | Admitting: Podiatry

## 2020-07-11 NOTE — Progress Notes (Signed)
Subjective:  Patient ID: Jennifer George, female    DOB: May 13, 1951,  MRN: [id number]  Chief Complaint  Patient presents with  . Foot Pain    left foot nerve/on top    69 y.o. female presents with the above complaint.  Patient presents with pain to the left dorsal midfoot.  Patient states this feels like a nerve pain.  Patient there are some numbness tingling associated with it.  There is hurts across the circumferential around the midfoot.  Patient saw Dr. Earleen Newport last year but I discussed about nerve being in close during the case to call leading to some pain.  Patient has also had a history of dealing with plantar fasciitis in the past.  She denies any other acute complaints.  She would like to discuss treatment options.  She has not been seen anyone else prior to seeing me for this particular thing today.   Review of Systems: Negative except as noted in the HPI. Denies N/V/F/Ch.  Past Medical History:  Diagnosis Date  . Acute kidney injury ([place])    07-20-2015  . Arthritis   . Depression   . Hypothyroidism   . Mild acid reflux   . Multicystic kidney    bilateral-- congenital  (right cyst drainage x2 by radiologist-- 07-20-2015 959ml and 07-22-2015  . Pulmonary nodule    left side  per CT 07-19-2015  . Retroperitoneal fibrosis   . Wears contact lenses   . WPW (Wolff-Parkinson-White syndrome) last visit cardiologist dr Caryl Comes 2010--  per pt no issues or symptoms since   WITH LEFT POSTEROLATERAL ACCESORY PATHWAY-- S/P  ABLATION  11-23-2008    Current Outpatient Medications:  .  acetaminophen (TYLENOL) 500 MG tablet, Take 1,000 mg by mouth every 6 (six) hours as needed., Disp: , Rfl:  .  aspirin 81 MG tablet, Take 81 mg by mouth daily., Disp: , Rfl:  .  atorvastatin (LIPITOR) 20 MG tablet, Take 20 mg by mouth every morning. , Disp: , Rfl: 11 .  bifidobacterium infantis (ALIGN) capsule, Take 1 capsule by mouth every morning. , Disp: , Rfl:  .  cholecalciferol (VITAMIN D) 1000  UNITS tablet, Take 1,000 Units by mouth daily., Disp: , Rfl:  .  diclofenac (VOLTAREN) 75 MG EC tablet, Take 75 mg by mouth daily. , Disp: , Rfl:  .  estradiol (VIVELLE-DOT) 0.05 MG/24HR patch, Place 1 patch onto the skin 2 (two) times a week. Tues and Saturday., Disp: , Rfl:  .  fexofenadine (ALLEGRA) 180 MG tablet, Take 180 mg by mouth every morning. , Disp: , Rfl:  .  levothyroxine (SYNTHROID, LEVOTHROID) 88 MCG tablet, Take 88 mcg by mouth daily before breakfast. ON Sunday'S TAKES TWO TABLETS, Disp: , Rfl:  .  methocarbamol (ROBAXIN) 500 MG tablet, Take 1 tablet (500 mg total) by mouth 2 (two) times daily as needed for muscle spasms., Disp: 20 tablet, Rfl: 0 .  Polyethylene Glycol 3350 (MIRALAX PO), Take by mouth every morning. , Disp: , Rfl:  .  progesterone (PROMETRIUM) 200 MG capsule, TAKE 1 CAPSULE(S) EVERY DAY BY ORAL ROUTE FOR 14 NIGHTS EVERY OTHER MONTH, Disp: , Rfl: 6 .  pyridOXINE (VITAMIN B-6) 100 MG tablet, Take 100 mg by mouth daily., Disp: , Rfl:  .  traZODone (DESYREL) 100 MG tablet, Take 200-300 mg by mouth at bedtime. , Disp: , Rfl: 5 .  urea (GORDONS UREA) 40 % ointment, Apply topically as needed., Disp: 30 g, Rfl: 0 .  vitamin B-12 (CYANOCOBALAMIN) 1000 MCG  tablet, Take 1,000 mcg by mouth daily., Disp: , Rfl:  .  vitamin C (ASCORBIC ACID) 500 MG tablet, Take 1,000 mg by mouth daily., Disp: , Rfl:   Social History   Tobacco Use  Smoking Status Current Every Day Smoker  . Packs/day: 0.80  . Years: 46.00  . Pack years: 36.80  . Types: Cigarettes  Smokeless Tobacco Never Used    Allergies  Allergen Reactions  . Tape Other (See Comments)    Skin red irritation   Objective:  There were no vitals filed for this visit. There is no height or weight on file to calculate BMI. Constitutional Well developed. Well nourished.  Vascular Dorsalis pedis pulses palpable bilaterally. Posterior tibial pulses palpable bilaterally. Capillary refill normal to all digits.  No  cyanosis or clubbing noted. Pedal hair growth normal.  Neurologic Normal speech. Oriented to person, place, and time. Epicritic sensation to light touch grossly present bilaterally.  Dermatologic Nails well groomed and normal in appearance. No open wounds. No skin lesions.  Orthopedic:  Pain on palpation to the dorsal midfoot left side negative Tinel's sign.  Good range of motion noted of the metatarsophalangeal joint 2 through 5 without any pain active or passive range of motion left side   Radiographs: 3 views of skeletally mature adult bilateral foot: Left midfoot arthrosis noted to dorsal midtarsal joints.  No other bony abnormalities identified.  No other osteoarthritic changes noted Assessment:   1. Arthritis of midfoot   2. Capsulitis of left foot    Plan:  Patient was evaluated and treated and all questions answered.  Left midfoot arthritis with underlying capsulitis -I explained the patient the etiology of arthritis and various treatment options were discussed.  I believe patient will benefit from a steroid injection will decrease acute inflammatory component associated with pain.  Given the amount of pain she is having I believe she will benefit from a steroid injection.  Patient agrees with the plan would like to proceed with a steroid injection -A steroid injection was performed at left midfoot using 1% plain Lidocaine and 10 mg of Kenalog. This was well tolerated.   No follow-ups on file.

## 2020-07-26 DIAGNOSIS — N950 Postmenopausal bleeding: Secondary | ICD-10-CM | POA: Diagnosis not present

## 2020-07-26 DIAGNOSIS — N8501 Benign endometrial hyperplasia: Secondary | ICD-10-CM | POA: Diagnosis not present

## 2020-07-26 DIAGNOSIS — N939 Abnormal uterine and vaginal bleeding, unspecified: Secondary | ICD-10-CM | POA: Diagnosis not present

## 2020-08-08 DIAGNOSIS — N8500 Endometrial hyperplasia, unspecified: Secondary | ICD-10-CM | POA: Diagnosis not present

## 2020-08-16 ENCOUNTER — Other Ambulatory Visit: Payer: Self-pay

## 2020-08-16 ENCOUNTER — Ambulatory Visit: Payer: Medicare HMO | Admitting: Podiatry

## 2020-08-16 ENCOUNTER — Encounter: Payer: Self-pay | Admitting: Podiatry

## 2020-08-16 DIAGNOSIS — M778 Other enthesopathies, not elsewhere classified: Secondary | ICD-10-CM

## 2020-08-16 DIAGNOSIS — M19079 Primary osteoarthritis, unspecified ankle and foot: Secondary | ICD-10-CM

## 2020-08-16 NOTE — Progress Notes (Signed)
Subjective:  Patient ID: Jennifer George, female    DOB: [birth year],  MRN: [id number]  Chief Complaint  Patient presents with  . Arthritis    PT stated that she is doing well, she stated that she Is not having any pain at this time.    69 y.o. female presents with the above complaint.  Patient presents with follow-up of left dorsal midfoot.  Patient states that she is doing a lot better she does not any pain.  It took a while for the injection to start working.  She has been able to return to activities without any acute issues.   Review of Systems: Negative except as noted in the HPI. Denies N/V/F/Ch.  Past Medical History:  Diagnosis Date  . Acute kidney injury ([facility])    07-20-2015  . Arthritis   . Depression   . Hypothyroidism   . Mild acid reflux   . Multicystic kidney    bilateral-- congenital  (right cyst drainage x2 by radiologist-- 07-20-2015 and 07-22-2015  . Pulmonary nodule    left side  per CT 07-19-2015  . Retroperitoneal fibrosis   . Wears contact lenses   . WPW (Wolff-Parkinson-White syndrome) last visit cardiologist dr Graciela Husbands 2010--  per pt no issues or symptoms since   WITH LEFT POSTEROLATERAL ACCESORY PATHWAY-- S/P  ABLATION  11-23-2008    Current Outpatient Medications:  .  acetaminophen (TYLENOL) 500 MG tablet, Take 1,000 mg by mouth every 6 (six) hours as needed., Disp: , Rfl:  .  aspirin 81 MG tablet, Take 81 mg by mouth daily., Disp: , Rfl:  .  atorvastatin (LIPITOR) 20 MG tablet, Take 20 mg by mouth every morning. , Disp: , Rfl: 11 .  bifidobacterium infantis (ALIGN) capsule, Take 1 capsule by mouth every morning. , Disp: , Rfl:  .  cholecalciferol (VITAMIN D) 1000 UNITS tablet, Take 1,000 Units by mouth daily., Disp: , Rfl:  .  diclofenac (VOLTAREN) 75 MG EC tablet, Take 75 mg by mouth daily. , Disp: , Rfl:  .  estradiol (VIVELLE-DOT) 0.05 MG/24HR patch, Place 1 patch onto the skin 2 (two) times a week. Tues and Saturday., Disp: , Rfl:  .   fexofenadine (ALLEGRA) 180 MG tablet, Take 180 mg by mouth every morning. , Disp: , Rfl:  .  levothyroxine (SYNTHROID, LEVOTHROID) 88 MCG tablet, Take 88 mcg by mouth daily before breakfast. ON Sunday'S TAKES TWO TABLETS, Disp: , Rfl:  .  methocarbamol (ROBAXIN) 500 MG tablet, Take 1 tablet (500 mg total) by mouth 2 (two) times daily as needed for muscle spasms., Disp: 20 tablet, Rfl: 0 .  Polyethylene Glycol 3350 (MIRALAX PO), Take by mouth every morning. , Disp: , Rfl:  .  progesterone (PROMETRIUM) 200 MG capsule, TAKE 1 CAPSULE(S) EVERY DAY BY ORAL ROUTE FOR 14 NIGHTS EVERY OTHER MONTH, Disp: , Rfl: 6 .  pyridOXINE (VITAMIN B-6) 100 MG tablet, Take 100 mg by mouth daily., Disp: , Rfl:  .  traZODone (DESYREL) 100 MG tablet, Take 200-300 mg by mouth at bedtime. , Disp: , Rfl: 5 .  urea (GORDONS UREA) 40 % ointment, Apply topically as needed., Disp: 30 g, Rfl: 0 .  vitamin B-12 (CYANOCOBALAMIN) 1000 MCG tablet, Take 1,000 mcg by mouth daily., Disp: , Rfl:  .  vitamin C (ASCORBIC ACID) 500 MG tablet, Take 1,000 mg by mouth daily., Disp: , Rfl:   Social History   Tobacco Use  Smoking Status Current Every Day Smoker  . Packs/day: 0.80  .  Years: 46.00  . Pack years: 36.80  . Types: Cigarettes  Smokeless Tobacco Never Used    Allergies  Allergen Reactions  . Tape Other (See Comments)    Skin red irritation   Objective:  There were no vitals filed for this visit. There is no height or weight on file to calculate BMI. Constitutional Well developed. Well nourished.  Vascular Dorsalis pedis pulses palpable bilaterally. Posterior tibial pulses palpable bilaterally. Capillary refill normal to all digits.  No cyanosis or clubbing noted. Pedal hair growth normal.  Neurologic Normal speech. Oriented to person, place, and time. Epicritic sensation to light touch grossly present bilaterally.  Dermatologic Nails well groomed and normal in appearance. No open wounds. No skin lesions.   Orthopedic:  No Pain on palpation to the dorsal midfoot left side negative Tinel's sign.  Good range of motion noted of the metatarsophalangeal joint 2 through 5 without any pain active or passive range of motion left side   Radiographs: 3 views of skeletally mature adult bilateral foot: Left midfoot arthrosis noted to dorsal midtarsal joints.  No other bony abnormalities identified.  No other osteoarthritic changes noted Assessment:   1. Arthritis of midfoot   2. Capsulitis of left foot    Plan:  Patient was evaluated and treated and all questions answered.  Left midfoot arthritis with underlying capsulitis -Clinically healed with 1 steroid injection.  Patient pain has completely resolved.  She is able to return to regular activities without complaints.  I discussed with her if any foot and ankle issues arise in the future to come back and see me.  Patient states understanding.   No follow-ups on file.

## 2020-09-01 ENCOUNTER — Other Ambulatory Visit: Payer: Self-pay | Admitting: Podiatry

## 2020-09-01 DIAGNOSIS — M778 Other enthesopathies, not elsewhere classified: Secondary | ICD-10-CM

## 2020-09-12 DIAGNOSIS — E785 Hyperlipidemia, unspecified: Secondary | ICD-10-CM | POA: Diagnosis not present

## 2020-09-12 DIAGNOSIS — N950 Postmenopausal bleeding: Secondary | ICD-10-CM | POA: Diagnosis not present

## 2020-09-12 DIAGNOSIS — Z01818 Encounter for other preprocedural examination: Secondary | ICD-10-CM | POA: Diagnosis not present

## 2020-09-12 DIAGNOSIS — E039 Hypothyroidism, unspecified: Secondary | ICD-10-CM | POA: Diagnosis not present

## 2020-09-12 DIAGNOSIS — Z720 Tobacco use: Secondary | ICD-10-CM | POA: Diagnosis not present

## 2020-09-12 DIAGNOSIS — J449 Chronic obstructive pulmonary disease, unspecified: Secondary | ICD-10-CM | POA: Diagnosis not present

## 2020-09-26 DIAGNOSIS — J302 Other seasonal allergic rhinitis: Secondary | ICD-10-CM | POA: Diagnosis not present

## 2020-09-26 DIAGNOSIS — L0201 Cutaneous abscess of face: Secondary | ICD-10-CM | POA: Diagnosis not present

## 2020-09-26 DIAGNOSIS — L03211 Cellulitis of face: Secondary | ICD-10-CM | POA: Diagnosis not present

## 2020-12-07 DIAGNOSIS — E785 Hyperlipidemia, unspecified: Secondary | ICD-10-CM | POA: Diagnosis not present

## 2020-12-07 DIAGNOSIS — E039 Hypothyroidism, unspecified: Secondary | ICD-10-CM | POA: Diagnosis not present

## 2020-12-11 DIAGNOSIS — Z1339 Encounter for screening examination for other mental health and behavioral disorders: Secondary | ICD-10-CM | POA: Diagnosis not present

## 2020-12-11 DIAGNOSIS — Z0000 Encounter for general adult medical examination without abnormal findings: Secondary | ICD-10-CM | POA: Diagnosis not present

## 2020-12-11 DIAGNOSIS — R82998 Other abnormal findings in urine: Secondary | ICD-10-CM | POA: Diagnosis not present

## 2020-12-11 DIAGNOSIS — M1990 Unspecified osteoarthritis, unspecified site: Secondary | ICD-10-CM | POA: Diagnosis not present

## 2020-12-11 DIAGNOSIS — Z720 Tobacco use: Secondary | ICD-10-CM | POA: Diagnosis not present

## 2020-12-11 DIAGNOSIS — E785 Hyperlipidemia, unspecified: Secondary | ICD-10-CM | POA: Diagnosis not present

## 2020-12-11 DIAGNOSIS — E042 Nontoxic multinodular goiter: Secondary | ICD-10-CM | POA: Diagnosis not present

## 2020-12-11 DIAGNOSIS — Q613 Polycystic kidney, unspecified: Secondary | ICD-10-CM | POA: Diagnosis not present

## 2020-12-11 DIAGNOSIS — M720 Palmar fascial fibromatosis [Dupuytren]: Secondary | ICD-10-CM | POA: Diagnosis not present

## 2020-12-11 DIAGNOSIS — Z1331 Encounter for screening for depression: Secondary | ICD-10-CM | POA: Diagnosis not present

## 2020-12-11 DIAGNOSIS — E039 Hypothyroidism, unspecified: Secondary | ICD-10-CM | POA: Diagnosis not present

## 2020-12-11 DIAGNOSIS — J449 Chronic obstructive pulmonary disease, unspecified: Secondary | ICD-10-CM | POA: Diagnosis not present

## 2020-12-26 ENCOUNTER — Other Ambulatory Visit: Payer: Self-pay | Admitting: Internal Medicine

## 2020-12-26 DIAGNOSIS — Z1231 Encounter for screening mammogram for malignant neoplasm of breast: Secondary | ICD-10-CM

## 2020-12-28 ENCOUNTER — Other Ambulatory Visit: Payer: Self-pay

## 2020-12-28 ENCOUNTER — Ambulatory Visit (INDEPENDENT_AMBULATORY_CARE_PROVIDER_SITE_OTHER): Payer: Medicare HMO | Admitting: Podiatry

## 2020-12-28 DIAGNOSIS — M19079 Primary osteoarthritis, unspecified ankle and foot: Secondary | ICD-10-CM | POA: Diagnosis not present

## 2020-12-28 DIAGNOSIS — M778 Other enthesopathies, not elsewhere classified: Secondary | ICD-10-CM | POA: Diagnosis not present

## 2020-12-28 MED ORDER — METHYLPREDNISOLONE 4 MG PO TBPK: ORAL_TABLET | ORAL | 0 refills | Status: DC

## 2020-12-29 ENCOUNTER — Encounter: Payer: Self-pay | Admitting: Podiatry

## 2020-12-29 NOTE — Progress Notes (Signed)
Subjective:  Patient ID: Jennifer George, female    DOB: Dec 20, 1950,  MRN: [id number]  Chief Complaint  Patient presents with  . Arthritis    Left foot pain, requesting injection    70 y.o. female presents with the above complaint.  Patient presents with follow-up of left dorsal midfoot pain.  Patient states it came back again.  It stayed away for many months but now it seems like she started to hurt again.  She did a lot on her foot.  The injection was success last time.  She would like to discuss doing another injection.  She denies any other acute complaints   Review of Systems: Negative except as noted in the HPI. Denies N/V/F/Ch.  Past Medical History:  Diagnosis Date  . Acute kidney injury ([place])    07-20-2015  . Arthritis   . Depression   . Hypothyroidism   . Mild acid reflux   . Multicystic kidney    bilateral-- congenital  (right cyst drainage x2 by radiologist-- 07-20-2015 973ml and 07-22-2015  . Pulmonary nodule    left side  per CT 07-19-2015  . Retroperitoneal fibrosis   . Wears contact lenses   . WPW (Wolff-Parkinson-White syndrome) last visit cardiologist dr Caryl Comes 2010--  per pt no issues or symptoms since   WITH LEFT POSTEROLATERAL ACCESORY PATHWAY-- S/P  ABLATION  11-23-2008    Current Outpatient Medications:  .  methylPREDNISolone (MEDROL DOSEPAK) 4 MG TBPK tablet, Take as directed, Disp: 21 each, Rfl: 0 .  acetaminophen (TYLENOL) 500 MG tablet, Take 1,000 mg by mouth every 6 (six) hours as needed., Disp: , Rfl:  .  aspirin 81 MG tablet, Take 81 mg by mouth daily., Disp: , Rfl:  .  atorvastatin (LIPITOR) 20 MG tablet, Take 20 mg by mouth every morning. , Disp: , Rfl: 11 .  bifidobacterium infantis (ALIGN) capsule, Take 1 capsule by mouth every morning. , Disp: , Rfl:  .  cholecalciferol (VITAMIN D) 1000 UNITS tablet, Take 1,000 Units by mouth daily., Disp: , Rfl:  .  diclofenac (VOLTAREN) 75 MG EC tablet, Take 75 mg by mouth daily. , Disp: , Rfl:  .   estradiol (VIVELLE-DOT) 0.05 MG/24HR patch, Place 1 patch onto the skin 2 (two) times a week. Tues and Saturday., Disp: , Rfl:  .  fexofenadine (ALLEGRA) 180 MG tablet, Take 180 mg by mouth every morning. , Disp: , Rfl:  .  levothyroxine (SYNTHROID, LEVOTHROID) 88 MCG tablet, Take 88 mcg by mouth daily before breakfast. ON Sunday'S TAKES TWO TABLETS, Disp: , Rfl:  .  methocarbamol (ROBAXIN) 500 MG tablet, Take 1 tablet (500 mg total) by mouth 2 (two) times daily as needed for muscle spasms., Disp: 20 tablet, Rfl: 0 .  Polyethylene Glycol 3350 (MIRALAX PO), Take by mouth every morning. , Disp: , Rfl:  .  progesterone (PROMETRIUM) 200 MG capsule, TAKE 1 CAPSULE(S) EVERY DAY BY ORAL ROUTE FOR 14 NIGHTS EVERY OTHER MONTH, Disp: , Rfl: 6 .  pyridOXINE (VITAMIN B-6) 100 MG tablet, Take 100 mg by mouth daily., Disp: , Rfl:  .  traZODone (DESYREL) 100 MG tablet, Take 200-300 mg by mouth at bedtime. , Disp: , Rfl: 5 .  urea (GORDONS UREA) 40 % ointment, Apply topically as needed., Disp: 30 g, Rfl: 0 .  vitamin B-12 (CYANOCOBALAMIN) 1000 MCG tablet, Take 1,000 mcg by mouth daily., Disp: , Rfl:  .  vitamin C (ASCORBIC ACID) 500 MG tablet, Take 1,000 mg by mouth daily., Disp: ,  Rfl:   Social History   Tobacco Use  Smoking Status Current Every Day Smoker  . Packs/day: 0.80  . Years: 46.00  . Pack years: 36.80  . Types: Cigarettes  Smokeless Tobacco Never Used    Allergies  Allergen Reactions  . Tape Other (See Comments)    Skin red irritation   Objective:  There were no vitals filed for this visit. There is no height or weight on file to calculate BMI. Constitutional Well developed. Well nourished.  Vascular Dorsalis pedis pulses palpable bilaterally. Posterior tibial pulses palpable bilaterally. Capillary refill normal to all digits.  No cyanosis or clubbing noted. Pedal hair growth normal.  Neurologic Normal speech. Oriented to person, place, and time. Epicritic sensation to light touch  grossly present bilaterally.  Dermatologic Nails well groomed and normal in appearance. No open wounds. No skin lesions.  Orthopedic:  Pain on palpation to the dorsal midfoot left side negative Tinel's sign.  Good range of motion noted of the metatarsophalangeal joint 2 through 5 without any pain active or passive range of motion left side   Radiographs: 3 views of skeletally mature adult bilateral foot: Left midfoot arthrosis noted to dorsal midtarsal joints.  No other bony abnormalities identified.  No other osteoarthritic changes noted Assessment:   1. Arthritis of midfoot   2. Capsulitis of left foot    Plan:  Patient was evaluated and treated and all questions answered.  Left midfoot arthritis with underlying capsulitis -I explained the patient the etiology of arthritis and various treatment options were discussed.  I believe patient will benefit from a steroid injection will decrease acute inflammatory component associated with pain.  Given the amount of pain she is having I believe she will benefit from a steroid injection.  Patient agrees with the plan would like to proceed with a steroid injection -A steroid injection was performed at left midfoot using 1% plain Lidocaine and 10 mg of Kenalog. This was well tolerated. -Medrol Dosepak was also sent to the pharmacy   No follow-ups on file.

## 2021-01-04 DIAGNOSIS — Z01818 Encounter for other preprocedural examination: Secondary | ICD-10-CM | POA: Diagnosis not present

## 2021-01-04 DIAGNOSIS — Z01812 Encounter for preprocedural laboratory examination: Secondary | ICD-10-CM | POA: Diagnosis not present

## 2021-01-09 ENCOUNTER — Encounter (HOSPITAL_BASED_OUTPATIENT_CLINIC_OR_DEPARTMENT_OTHER): Payer: Self-pay | Admitting: Obstetrics and Gynecology

## 2021-01-11 ENCOUNTER — Other Ambulatory Visit: Payer: Self-pay

## 2021-01-11 ENCOUNTER — Encounter (HOSPITAL_BASED_OUTPATIENT_CLINIC_OR_DEPARTMENT_OTHER): Payer: Self-pay | Admitting: Obstetrics and Gynecology

## 2021-01-11 NOTE — Progress Notes (Signed)
Spoke w/ via phone for pre-op interview---PT Lab needs dos---- NONE              Lab results------see below COVID test ----01-16-2021 1245 pm (pt extended recovery) Arrive at -------600 am 01-18-2021 NPO after MN NO Solid Food.  Clear liquids from MN until---500 am then npo Med rec completed Medications to take morning of surgery -----allegra, flonase, levothyroxine, atorvastatin Diabetic medication -----n/a Patient instructed no nail polish to be worn day of surgery Patient instructed to bring photo id and insurance card day of surgery Patient aware to have Driver (ride ) / caregiver  Spouse charlie will come back when in recovery   for 24 hours after surgery  Patient Special Instructions -----pt given extended stay instructions Pre-Op special Istructions -----no smoking 24 hours before surgery Patient verbalized understanding of instructions that were given at this phone interview. Patient denies shortness of breath, chest pain, fever, cough at this phone interview.  Medical clearance note brittany [place] [facility]np 09-12-2020 on chart for 01-18-2021 surgery  ekg 09-12-2020 [facility] on chart Chest xray 09-12-2020 [facility] on chart  Hx of wpw had ablation with dr Caryl Comes 2010 no problems since Echo  10-05-2018 epic

## 2021-01-11 NOTE — Progress Notes (Signed)
YOU ARE SCHEDULED FOR A COVID TEST  01-16-2021 at 1245 PM. THIS TEST MUST BE DONE BEFORE SURGERY. GO TO  [facility]. [place], [place], IT IS APPROXIMATELY 2 MINUTES PAST [employer] ON THE RIGHT AND REMAIN IN YOUR CAR, THIS IS A DRIVE UP TEST     Your procedure is scheduled on 01-18-2021  Report to [facility] AT 600  A. M.   Call this number if you have problems the morning of surgery  :[phone].   OUR ADDRESS IS [place].  WE ARE LOCATED IN [facility].  PLEASE BRING YOUR INSURANCE CARD AND PHOTO ID DAY OF SURGERY.  ONLY ONE PERSON ALLOWED IN FACILITY WAITING AREA.                                     REMEMBER:  DO NOT EAT FOOD, CANDY GUM OR MINTS  AFTER MIDNIGHT . YOU MAY HAVE CLEAR LIQUIDS FROM MIDNIGHT UNTIL 500 AM. NO CLEAR LIQUIDS AFTER 500 AM DAY OF SURGERY.   YOU MAY  BRUSH YOUR TEETH MORNING OF SURGERY AND RINSE YOUR MOUTH OUT, NO CHEWING GUM CANDY OR MINTS.    CLEAR LIQUID DIET   Foods Allowed                                                                     Foods Excluded  Coffee and tea, regular and decaf                             liquids that you cannot  Plain Jell-O any favor except red or purple                                           see through such as: Fruit ices (not with fruit pulp)                                     milk, soups, orange juice  Iced Popsicles                                    All solid food Carbonated beverages, regular and diet                                    Cranberry, grape and apple juices Sports drinks like Gatorade Lightly seasoned clear broth or consume(fat free) Sugar, honey syrup  Sample Menu Breakfast                                Lunch  Supper Cranberry juice                    Beef broth                            Chicken broth Jell-O                                     Grape juice                           Apple  juice Coffee or tea                        Jell-O                                      Popsicle                                                Coffee or tea                        Coffee or tea  _____________________________________________________________________     TAKE THESE MEDICATIONS MORNING OF SURGERY WITH A SIP OF WATER:   ALLEGRA, FLONASE NASAL SPRAY IF NEEDED, LEVOTHYROXINE, ATORVASTATIN.  ONE VISITOR IS ALLOWED IN WAITING ROOM ONLY DAY OF SURGERY.  NO VISITOR MAY SPEND THE NIGHT.  VISITOR ARE ALLOWED TO STAY UNTIL 800 PM.                                    DO NOT WEAR JEWERLY, MAKE UP. DO NOT WEAR LOTIONS, POWDERS, PERFUMES OR NAIL POLISH. DO NOT SHAVE FOR 24 HOURS PRIOR TO DAY OF SURGERY. MEN MAY SHAVE FACE AND NECK. CONTACTS, GLASSES, OR DENTURES MAY NOT BE WORN TO SURGERY.                                    [facility] IS NOT RESPONSIBLE  FOR ANY BELONGINGS.                                                                    Marland Kitchen           [facility] - Preparing for Surgery Before surgery, you can play an important role.  Because skin is not sterile, your skin needs to be as free of germs as possible.  You can reduce the number of germs on your skin by washing with CHG (chlorahexidine gluconate) soap before surgery.  CHG is an antiseptic cleaner which kills germs and bonds with the skin to continue killing germs even after washing. Please DO NOT use if you have an allergy to CHG or  antibacterial soaps.  If your skin becomes reddened/irritated stop using the CHG and inform your nurse when you arrive at Short Stay. Do not shave (including legs and underarms) for at least 48 hours prior to the first CHG shower.  You may shave your face/neck. Please follow these instructions carefully:  1.  Shower with CHG Soap the night before surgery and the  morning of Surgery.  2.  If you choose to wash your hair, wash your hair first as usual with your  normal  shampoo.  3.  After you shampoo,  rinse your hair and body thoroughly to remove the  shampoo.                            4.  Use CHG as you would any other liquid soap.  You can apply chg directly  to the skin and wash                      Gently with a scrungie or clean washcloth.  5.  Apply the CHG Soap to your body ONLY FROM THE NECK DOWN.   Do not use on face/ open                           Wound or open sores. Avoid contact with eyes, ears mouth and genitals (private parts).                       Wash face,  Genitals (private parts) with your normal soap.             6.  Wash thoroughly, paying special attention to the area where your surgery  will be performed.  7.  Thoroughly rinse your body with warm water from the neck down.  8.  DO NOT shower/wash with your normal soap after using and rinsing off  the CHG Soap.                9.  Pat yourself dry with a clean towel.            10.  Wear clean pajamas.            11.  Place clean sheets on your bed the night of your first shower and do not  sleep with pets. Day of Surgery : Do not apply any lotions/deodorants the morning of surgery.  Please wear clean clothes to the hospital/surgery center.  FAILURE TO FOLLOW THESE INSTRUCTIONS MAY RESULT IN THE CANCELLATION OF YOUR SURGERY PATIENT SIGNATURE_________________________________  NURSE SIGNATURE__________________________________  ________________________________________________________________________                                                        QUESTIONS Hansel Feinstein PRE OP NURSE PHONE [phone]

## 2021-01-12 ENCOUNTER — Ambulatory Visit
Admission: RE | Admit: 2021-01-12 | Discharge: 2021-01-12 | Disposition: A | Discharge status: Home / Self Care | Payer: Medicare HMO | Source: Ambulatory Visit

## 2021-01-12 DIAGNOSIS — Z1231 Encounter for screening mammogram for malignant neoplasm of breast: Secondary | ICD-10-CM | POA: Diagnosis not present

## 2021-01-16 ENCOUNTER — Encounter (HOSPITAL_COMMUNITY)
Admission: RE | Admit: 2021-01-16 | Discharge: 2021-01-16 | Disposition: A | Discharge status: Home / Self Care | Payer: Medicare HMO | Source: Ambulatory Visit | Attending: Obstetrics and Gynecology | Admitting: Obstetrics and Gynecology

## 2021-01-16 ENCOUNTER — Other Ambulatory Visit: Payer: Self-pay

## 2021-01-16 ENCOUNTER — Other Ambulatory Visit (HOSPITAL_COMMUNITY)
Admission: RE | Admit: 2021-01-16 | Discharge: 2021-01-16 | Disposition: A | Discharge status: Home / Self Care | Payer: Medicare HMO | Source: Ambulatory Visit | Attending: Obstetrics and Gynecology | Admitting: Obstetrics and Gynecology

## 2021-01-16 DIAGNOSIS — U071 COVID-19: Secondary | ICD-10-CM | POA: Insufficient documentation

## 2021-01-16 DIAGNOSIS — Z01812 Encounter for preprocedural laboratory examination: Secondary | ICD-10-CM | POA: Insufficient documentation

## 2021-01-16 HISTORY — DX: COVID-19: U07.1

## 2021-01-16 LAB — COMPREHENSIVE METABOLIC PANEL
ALT: 17 U/L (ref 0–44)
AST: 15 U/L (ref 15–41)
Albumin: 3.9 g/dL (ref 3.5–5.0)
Alkaline Phosphatase: 70 U/L (ref 38–126)
Anion gap: 5 (ref 5–15)
BUN: 19 mg/dL (ref 8–23)
CO2: 26 mmol/L (ref 22–32)
Calcium: 10.1 mg/dL (ref 8.9–10.3)
Chloride: 107 mmol/L (ref 98–111)
Creatinine, Ser: 0.92 mg/dL (ref 0.44–1.00)
GFR, Estimated: >60 mL/min (ref >60)
Glucose, Bld: 100 mg/dL — ABNORMAL HIGH (ref 70–99)
Potassium: 4.6 mmol/L (ref 3.5–5.1)
Sodium: 138 mmol/L (ref 135–145)
Total Bilirubin: 0.6 mg/dL (ref 0.3–1.2)
Total Protein: 6.8 g/dL (ref 6.5–8.1)

## 2021-01-16 LAB — CBC
HCT: 48.5 % — ABNORMAL HIGH (ref 36.0–46.0)
Hemoglobin: 16.4 g/dL — ABNORMAL HIGH (ref 12.0–15.0)
MCH: 31 pg (ref 26.0–34.0)
MCHC: 33.8 g/dL (ref 30.0–36.0)
MCV: 91.7 fL (ref 80.0–100.0)
Platelets: 332 10*3/uL (ref 150–400)
RBC: 5.29 MIL/uL — ABNORMAL HIGH (ref 3.87–5.11)
RDW: 14.4 % (ref 11.5–15.5)
WBC: 9.2 10*3/uL (ref 4.0–10.5)
nRBC: 0 % (ref 0.0–0.2)

## 2021-01-16 LAB — SARS CORONAVIRUS 2 (TAT 6-24 HRS): SARS Coronavirus 2: POSITIVE — AB

## 2021-01-16 NOTE — H&P (Signed)
Jennifer George is an 70 y.o. female [phone] with PMB.  Biopsy proven endometrial hyperplasia - simple, focal complex - no atypia.  D/W pt endometrial hyperplasia and management, possible progression.   At preop appointment, patient admitted to not taking progesterone since surgery was planned.  D/W pt r/b/a of surgery - also expectations and process.    Pertinent Gynecological History: [id number] - SVD x 3 MMG 11/2019 No abn pap, no STD  Menstrual History:  No LMP recorded. Patient is postmenopausal.    Past Medical History:  Diagnosis Date  . Acute kidney injury ([place])    07-20-2015  . Arthritis    oa right hand and left foot  . Complication of anesthesia    2010 heaRT ABLATION FOR WPW NO ISSUE SINCE  . COPD (chronic obstructive pulmonary disease) ([place])    beginnings of per pt  . Hypothyroidism   . Multicystic kidney    bilateral-- congenital  (right cyst drainage x2 by radiologist-- 07-20-2015 978ml and 07-22-2015  . PMB (postmenopausal bleeding)   . PONV (postoperative nausea and vomiting)    needs scopolamine patch for nausea dos  . Pulmonary nodule    left side  per CT 07-19-2015  . Wears contact lenses   . WPW (Wolff-Parkinson-White syndrome) last visit cardiologist dr Caryl Comes 2010--  per pt no issues or symptoms since   WITH LEFT POSTEROLATERAL ACCESORY PATHWAY-- S/P  ABLATION  11-23-2008    Past Surgical History:  Procedure Laterality Date  . CARDIAC ELECTROPHYSIOLOGY STUDY AND ABLATION  11-23-2008   DR Caryl Comes   Ablation left posterolateral accessory pathway  . CYSTOSCOPY W/ RETROGRADES Right 08/28/2015   Procedure: CYSTOSCOPY WITH RETROGRADE PYELOGRAM;  Surgeon: Franchot Gallo, MD;  [facility]  Service: Urology;  Laterality: Right;  . CYSTOSCOPY W/ URETERAL STENT PLACEMENT Right 07/28/2015   Procedure: CYSTOSCOPY WITH RETROGRADE PYELOGRAM/URETERAL STENT PLACEMENT;  Surgeon: Franchot Gallo, MD;  Location: [facility];  Service:  Urology;  Laterality: Right;  . CYSTOSCOPY W/ URETERAL STENT REMOVAL Right 08/28/2015   Procedure: CYSTOSCOPY WITH STENT REMOVAL;  Surgeon: Franchot Gallo, MD;  Location: [facility];  Service: Urology;  Laterality: Right;  . [place]  . EXCISION MORTON'S NEUROMA  2000  . POSTERIOR AND ENTEROCELE REPAIR/  CARDINAL UTEROSACRAL COLPOSUSPENSION  06-29-2009  . TRANSTHORACIC ECHOCARDIOGRAM  10-05-2008   normal LVF, ef 55-60%/  mild AV sclerosis and calcification without stenosis/  trivial MR and TR    FH: dementia  Social History:  reports that she has been smoking cigarettes. She has a 37.50 pack-year smoking history. She has never used smokeless tobacco. She reports current alcohol use. She reports that she does not use drugs. married  Allergies: No Known Allergies  Meds: atorvastatin, diclofenac, estradiol patch, flonase, levothyroxine, methocarbamol, trazadone, Vitamin B,C,D    Review of Systems  Constitutional: Negative.   HENT: Negative.   Eyes: Negative.   Respiratory: Negative.   Gastrointestinal: Negative.   Genitourinary: Positive for menstrual problem.       + PMB  Musculoskeletal: Negative.   Skin: Negative.   Neurological: Negative.   Psychiatric/Behavioral: Negative.     Height 5\' 8"  (1.727 m), weight 61.7 kg. Physical Exam Constitutional:      Appearance: Normal appearance.  HENT:     Head: Normocephalic and atraumatic.  Cardiovascular:     Rate and Rhythm: Normal rate and regular rhythm.  Pulmonary:     Effort: Pulmonary effort  is normal.     Breath sounds: Normal breath sounds.  Abdominal:     General: Bowel sounds are normal.     Palpations: Abdomen is soft.  Musculoskeletal:        General: Normal range of motion.     Cervical back: Normal range of motion and neck supple.  Skin:    General: Skin is warm and dry.  Neurological:     General: No focal deficit present.     Mental Status: She is alert  and oriented to person, place, and time.  Psychiatric:        Mood and Affect: Mood normal.        Behavior: Behavior normal.     Results for orders placed or performed during the hospital encounter of 01/16/21 (from the past 24 hour(s))  CBC     Status: Abnormal   Collection Time: 01/16/21 11:38 AM  Result Value Ref Range   WBC 9.2 4.0 - 10.5 K/uL   RBC 5.29 (H) 3.87 - 5.11 MIL/uL   Hemoglobin 16.4 (H) 12.0 - 15.0 g/dL   HCT 48.5 (H) 36.0 - 46.0 %   MCV 91.7 80.0 - 100.0 fL   MCH 31.0 26.0 - 34.0 pg   MCHC 33.8 30.0 - 36.0 g/dL   RDW 14.4 11.5 - 15.5 %   Platelets 332 150 - 400 K/uL   nRBC 0.0 0.0 - 0.2 %  Type and screen [facility]     Status: None   Collection Time: 01/16/21 11:38 AM  Result Value Ref Range   ABO/RH(D) A POS    Antibody Screen NEG    Sample Expiration 01/30/2021,2359    Extend sample reason      NO TRANSFUSIONS OR PREGNANCY IN THE PAST 3 MONTHS Performed at [facility], [place] [address] [place], [place] [address]   Comprehensive metabolic panel     Status: Abnormal   Collection Time: 01/16/21 11:38 AM  Result Value Ref Range   Sodium 138 135 - 145 mmol/L   Potassium 4.6 3.5 - 5.1 mmol/L   Chloride 107 98 - 111 mmol/L   CO2 26 22 - 32 mmol/L   Glucose, Bld 100 (H) 70 - 99 mg/dL   BUN 19 8 - 23 mg/dL   Creatinine, Ser 0.92 0.44 - 1.00 mg/dL   Calcium 10.1 8.9 - 10.3 mg/dL   Total Protein 6.8 6.5 - 8.1 g/dL   Albumin 3.9 3.5 - 5.0 g/dL   AST 15 15 - 41 U/L   ALT 17 0 - 44 U/L   Alkaline Phosphatase 70 38 - 126 U/L   Total Bilirubin 0.6 0.3 - 1.2 mg/dL   GFR, Estimated >60 >60 mL/min   Anion gap 5 5 - 15     Assessment/Plan: 69yo with PMB - endometrial hyperplasia for LAVH/BS/cysto D/w pt r/b/a of surgery Postop expectations Ancef for prophylaxis    Johntavious Francom Bovard-Stuckert 01/16/2021, 3:01 PM

## 2021-01-17 ENCOUNTER — Telehealth (HOSPITAL_COMMUNITY): Payer: Self-pay

## 2021-01-17 ENCOUNTER — Telehealth: Payer: Self-pay | Admitting: Physician Assistant

## 2021-01-17 NOTE — Telephone Encounter (Signed)
Called to discuss with Jennifer George about Covid symptoms and the use of bebtelivomab, remdisivir or oral therapies for those with mild to moderate Covid symptoms and at a high risk of hospitalization.     Pt does not qualify as pt has asymptomatic infection. Isolation precautions discussed. Advised to contact back for consideration should they develop symptoms. Patient verbalized understanding. Pt was just being screened for an upcoming gyne procedure. She will call us if she develops symptoms.  Patient Active Problem List   Diagnosis Date Noted  . Loss of transverse plantar arch, left 11/11/2017  . Renal cyst 07/22/2015  . Hydronephrosis determined by ultrasound 07/22/2015  . Hypothyroidism 07/22/2015  . Renal cyst, acquired, right   . Acute kidney injury ([place]) 07/20/2015  . Renal cyst, right 07/20/2015  . SMOKER 12/27/2008  . WOLFF (WOLFE)-PARKINSON-WHITE (WPW) SYNDROME 12/27/2008    Angelena Form PA-C

## 2021-01-17 NOTE — Telephone Encounter (Signed)
[id number] - Contacted surgeon - left a detailed message on her voicemail advising of patient's Positive Covid19 lab results. Contacted surgeon's office - spoke to [place] - advised of patient's Positive Covid19 lab results as well. MBM

## 2021-01-18 ENCOUNTER — Other Ambulatory Visit: Payer: Self-pay | Admitting: Physician Assistant

## 2021-01-18 ENCOUNTER — Ambulatory Visit (HOSPITAL_BASED_OUTPATIENT_CLINIC_OR_DEPARTMENT_OTHER)
Admission: RE | Admit: 2021-01-18 | Payer: Medicare HMO | Source: Home / Self Care | Admitting: Obstetrics and Gynecology

## 2021-01-18 HISTORY — DX: Chronic obstructive pulmonary disease, unspecified: J44.9

## 2021-01-18 HISTORY — DX: Postmenopausal bleeding: N95.0

## 2021-01-18 HISTORY — DX: Other specified postprocedural states: Z98.890

## 2021-01-18 HISTORY — DX: Nausea with vomiting, unspecified: R11.2

## 2021-01-18 HISTORY — DX: Other complications of anesthesia, initial encounter: T88.59XA

## 2021-01-18 LAB — TYPE AND SCREEN
ABO/RH(D): A POS
Antibody Screen: NEGATIVE

## 2021-01-18 SURGERY — HYSTERECTOMY, VAGINAL, LAPAROSCOPY-ASSISTED, WITH SALPINGECTOMY
Anesthesia: General

## 2021-01-18 MED ORDER — MOLNUPIRAVIR EUA 200MG CAPSULE: 4.0000 | ORAL_CAPSULE | Freq: Two times a day (BID) | ORAL | 0 refills | Status: AC

## 2021-01-18 NOTE — Telephone Encounter (Signed)
Pt called hotline and reported allergy like symptoms and wanted paxlovid called in. I called her back with no answer. Left a VM  Angelena Form PA-C  [facility]

## 2021-01-18 NOTE — Progress Notes (Signed)
Outpatient Oral COVID Treatment Note  I connected with Jennifer George on 01/18/2021/1:48 PM by telephone and verified that I am speaking with the correct person using two identifiers.  I discussed the limitations, risks, security, and privacy concerns of performing an evaluation and management service by telephone and the availability of in person appointments. I also discussed with the patient that there may be a patient responsible charge related to this service. The patient expressed understanding and agreed to proceed.  Patient location: home Provider location: office  Diagnosis: COVID-19 infection  Purpose of visit: Discussion of potential use of Molnupiravir or Paxlovid, a new treatment for mild to moderate COVID-19 viral infection in non-hospitalized patients.   Subjective: Patient is a 70 y.o. female who has been diagnosed with COVID 19 viral infection.  Their symptoms began on 6/1 with nasal burning.    Past Medical History:  Diagnosis Date  . Acute kidney injury ([place])    07-20-2015  . Arthritis    oa right hand and left foot  . Complication of anesthesia    2010 heaRT ABLATION FOR WPW NO ISSUE SINCE  . COPD (chronic obstructive pulmonary disease) ([place])    beginnings of per pt  . Hypothyroidism   . Multicystic kidney    bilateral-- congenital  (right cyst drainage x2 by radiologist-- 07-20-2015 96ml and 07-22-2015  . PMB (postmenopausal bleeding)   . PONV (postoperative nausea and vomiting)    needs scopolamine patch for nausea dos  . Pulmonary nodule    left side  per CT 07-19-2015  . Wears contact lenses   . WPW (Wolff-Parkinson-White syndrome) last visit cardiologist dr Caryl Comes 2010--  per pt no issues or symptoms since   WITH LEFT POSTEROLATERAL ACCESORY PATHWAY-- S/P  ABLATION  11-23-2008    No Known Allergies   Current Outpatient Medications:  .  Ascorbic Acid (VITAMIN C) 1000 MG tablet, Take 1,000 mg by mouth daily., Disp: , Rfl:  .  atorvastatin (LIPITOR) 20  MG tablet, Take 20 mg by mouth daily., Disp: , Rfl:  .  Chlorphen-Pseudoephed-APAP (TYLENOL ALLERGY SINUS PO), Take by mouth., Disp: , Rfl:  .  Chlorpheniramine-PSE-Ibuprofen (ADVIL ALLERGY SINUS) 2-30-200 MG TABS, Take by mouth as needed., Disp: , Rfl:  .  DICLOFENAC PO, Take 75 mg by mouth daily. Takes 75 mg in pm prn also, Disp: , Rfl:  .  estradiol (VIVELLE-DOT) 0.05 MG/24HR patch, Place 1 patch onto the skin 2 (two) times a week. Tues and Saturday., Disp: , Rfl:  .  fexofenadine (ALLEGRA) 180 MG tablet, Take 180 mg by mouth daily., Disp: , Rfl:  .  fluticasone (FLONASE) 50 MCG/ACT nasal spray, Place 2 sprays into both nostrils as needed., Disp: , Rfl:  .  ibuprofen (ADVIL) 200 MG tablet, Take 200 mg by mouth every 6 (six) hours as needed., Disp: , Rfl:  .  levothyroxine (SYNTHROID, LEVOTHROID) 88 MCG tablet, Take 88 mcg by mouth daily before breakfast. ON Sunday'S TAKES TWO TABLETS, Disp: , Rfl:  .  Polyethylene Glycol 3350 (MIRALAX PO), Take by mouth. 1 capful daily, Disp: , Rfl:  .  Probiotic Product (PROBIOTIC PO), Take by mouth daily. [facility], Disp: , Rfl:  .  traZODone (DESYREL) 100 MG tablet, Take 200-300 mg by mouth at bedtime. , Disp: , Rfl: 5 .  vitamin B-12 (CYANOCOBALAMIN) 1000 MCG tablet, Take 1,000 mcg by mouth daily., Disp: , Rfl:  .  VITAMIN D PO, Take by mouth. 1000 iu daily d 3, Disp: , Rfl:  Objective: Patient sounds stable.  They are in no apparent distress.  Breathing is non labored.  Mood and behavior are normal.  Laboratory Data:  Recent Results (from the past 2160 hour(s))  CBC     Status: Abnormal   Collection Time: 01/16/21 11:38 AM  Result Value Ref Range   WBC 9.2 4.0 - 10.5 K/uL   RBC 5.29 (H) 3.87 - 5.11 MIL/uL   Hemoglobin 16.4 (H) 12.0 - 15.0 g/dL   HCT 48.5 (H) 36.0 - 46.0 %   MCV 91.7 80.0 - 100.0 fL   MCH 31.0 26.0 - 34.0 pg   MCHC 33.8 30.0 - 36.0 g/dL   RDW 14.4 11.5 - 15.5 %   Platelets 332 150 - 400 K/uL   nRBC 0.0 0.0 - 0.2 %    Comment:  Performed at [facility], [place] [address] [place], [place] [address]  Type and screen [facility] [place]     Status: None   Collection Time: 01/16/21 11:38 AM  Result Value Ref Range   ABO/RH(D) A POS    Antibody Screen NEG    Sample Expiration 01/21/2021,2359    Extend sample reason      NO TRANSFUSIONS OR PREGNANCY IN THE PAST 3 MONTHS Performed at [facility], [place] [address] [place], [place] [address]   Comprehensive metabolic panel     Status: Abnormal   Collection Time: 01/16/21 11:38 AM  Result Value Ref Range   Sodium 138 135 - 145 mmol/L   Potassium 4.6 3.5 - 5.1 mmol/L   Chloride 107 98 - 111 mmol/L   CO2 26 22 - 32 mmol/L   Glucose, Bld 100 (H) 70 - 99 mg/dL    Comment: Glucose reference range applies only to samples taken after fasting for at least 8 hours.   BUN 19 8 - 23 mg/dL   Creatinine, Ser 0.92 0.44 - 1.00 mg/dL   Calcium 10.1 8.9 - 10.3 mg/dL   Total Protein 6.8 6.5 - 8.1 g/dL   Albumin 3.9 3.5 - 5.0 g/dL   AST 15 15 - 41 U/L   ALT 17 0 - 44 U/L   Alkaline Phosphatase 70 38 - 126 U/L   Total Bilirubin 0.6 0.3 - 1.2 mg/dL   GFR, Estimated >60 >60 mL/min    Comment: (NOTE) Calculated using the CKD-EPI Creatinine Equation (2021)    Anion gap 5 5 - 15    Comment: Performed at [facility], [place] [address] [place], [place] [address]  SARS CORONAVIRUS 2 (TAT 6-24 HRS) Nasopharyngeal Nasopharyngeal Swab     Status: Abnormal   Collection Time: 01/16/21 12:48 PM   Specimen: Nasopharyngeal Swab  Result Value Ref Range   SARS Coronavirus 2 POSITIVE (A) NEGATIVE    Comment: (NOTE) SARS-CoV-2 target nucleic acids are DETECTED.  The SARS-CoV-2 RNA is generally detectable in upper and lower respiratory specimens during the acute phase of infection. Positive results are indicative of the presence of SARS-CoV-2 RNA. Clinical correlation with patient history and other diagnostic information is   necessary to determine patient infection status. Positive results do not rule out bacterial infection or co-infection with other viruses.  The expected result is Negative.  Fact Sheet for Patients: SugarRoll.be  Fact Sheet for Healthcare Providers: https://www.woods-mathews.com/  This test is not yet approved or cleared by the [place] FDA and  has been authorized for detection and/or diagnosis of SARS-CoV-2 by FDA under an Emergency Use Authorization (EUA). This  EUA will remain  in effect (meaning this test can be used) for the duration of the COVID-19 declaration under Section 564(b)(1) of the Act, 21 U. S.C. section 360bbb-3(b)(1), unless the authorization is terminated or revoked sooner.   Performed at [facility], [place] [address] [place], [place] [address]      Assessment: 70 y.o. female with mild/moderate COVID 19 viral infection diagnosed on 6/1 at high risk for progression to severe COVID 19.  Plan:  This patient is a 70 y.o. female that meets the following criteria for Emergency Use Authorization of: Molnupiravir  1. Age >18 yr 2. SARS-COV-2 positive test 3. Symptom onset < 5 days 4. Mild-to-moderate COVID disease with high risk for severe progression to hospitalization or death   I have spoken and communicated the following to the patient or parent/caregiver regarding: 1. Molnupiravir is an unapproved drug that is authorized for use under an [employer] production planner.  2. There are no adequate, approved, available products for the treatment of COVID-19 in adults who have mild-to-moderate COVID-19 and are at high risk for progressing to severe COVID-19, including hospitalization or death. 3. Other therapeutics are currently authorized. For additional information on all products authorized for treatment or prevention of COVID-19, please see  TanEmporium.pl.  4. There are benefits and risks of taking this treatment as outlined in the "Fact Sheet for Patients and Caregivers."  5. "Fact Sheet for Patients and Caregivers" was reviewed with patient. A hard copy will be provided to patient from pharmacy prior to the patient receiving treatment. 6. Patients should continue to self-isolate and use infection control measures (e.g., wear mask, isolate, social distance, avoid sharing personal items, clean and disinfect "high touch" surfaces, and frequent handwashing) according to CDC guidelines.  7. The patient or parent/caregiver has the option to accept or refuse treatment. 8. [facility] has established a pregnancy surveillance program. 9. Females of childbearing potential should use a reliable method of contraception correctly and consistently, as applicable, for the duration of treatment and for 4 days after the last dose of Molnupiravir. 66. Males of reproductive potential who are sexually active with females of childbearing potential should use a reliable method of contraception correctly and consistently during treatment and for at least 3 months after the last dose. 11. Pregnancy status and risk was assessed. Patient verbalized understanding of precautions.   After reviewing above information with the patient, the patient agrees to receive molnupiravir.  Follow up instructions:    . Take prescription BID x 5 days as directed . Reach out to pharmacist for counseling on medication if desired . For concerns regarding further COVID symptoms please follow up with your PCP or urgent care . For urgent or life-threatening issues, seek care at your local emergency department  The patient was provided an opportunity to ask questions, and all were answered. The patient agreed with the plan and demonstrated an understanding of the  instructions.   [facility] and opted to pick up RX.  The patient was advised to call their PCP or seek an in-person evaluation if the symptoms worsen or if the condition fails to improve as anticipated.   I provided 10 minutes of non face-to-face telephone visit time during this encounter, and > 50% was spent counseling as documented under my assessment & plan. Called in this due to mild symptoms and several medication interactions with paxlovid.   Angelena Form, PA-C 01/18/2021 /1:48 PM

## 2021-03-06 ENCOUNTER — Encounter (HOSPITAL_BASED_OUTPATIENT_CLINIC_OR_DEPARTMENT_OTHER): Payer: Self-pay | Admitting: Obstetrics and Gynecology

## 2021-03-08 ENCOUNTER — Other Ambulatory Visit: Payer: Self-pay

## 2021-03-08 ENCOUNTER — Encounter (HOSPITAL_BASED_OUTPATIENT_CLINIC_OR_DEPARTMENT_OTHER): Payer: Self-pay | Admitting: Obstetrics and Gynecology

## 2021-03-08 DIAGNOSIS — N281 Cyst of kidney, acquired: Secondary | ICD-10-CM | POA: Diagnosis not present

## 2021-03-08 NOTE — Progress Notes (Signed)
Your procedure is scheduled on 03-22-2021  Report to [facility]   Call this number if you have problems the morning of surgery  :[phone].   OUR ADDRESS IS [place].  WE ARE LOCATED IN [facility].  PLEASE BRING YOUR INSURANCE CARD AND PHOTO ID DAY OF SURGERY.  ONLY ONE PERSON ALLOWED IN FACILITY WAITING AREA.                                     REMEMBER:  DO NOT EAT FOOD, CANDY GUM OR MINTS  AFTER MIDNIGHT . YOU MAY HAVE CLEAR LIQUIDS FROM MIDNIGHT UNTIL 430 AM. NO CLEAR LIQUIDS AFTER 430 AM DAY OF SURGERY.   YOU MAY  BRUSH YOUR TEETH MORNING OF SURGERY AND RINSE YOUR MOUTH OUT, NO CHEWING GUM CANDY OR MINTS.    CLEAR LIQUID DIET   Foods Allowed                                                                     Foods Excluded  Coffee and tea, regular and decaf                             liquids that you cannot  Plain Jell-O any favor except red or purple                                           see through such as: Fruit ices (not with fruit pulp)                                     milk, soups, orange juice  Iced Popsicles                                    All solid food Carbonated beverages, regular and diet                                    Cranberry, grape and apple juices Sports drinks like Gatorade Lightly seasoned clear broth or consume(fat free) Sugar, honey syrup  Sample Menu Breakfast                                Lunch                                     Supper Cranberry juice                    Beef broth  Chicken broth Jell-O                                     Grape juice                           Apple juice Coffee or tea                        Jell-O                                      Popsicle                                                Coffee or tea                        Coffee or tea  _____________________________________________________________________      TAKE THESE MEDICATIONS MORNING OF SURGERY WITH A SIP OF WATER: ALLEGRA, FLONASE, LEVOTHYROXINE, ATORVASTATIN.  ONE VISITOR IS ALLOWED IN WAITING ROOM ONLY DAY OF SURGERY.  NO VISITOR MAY SPEND THE NIGHT.  VISITOR ARE ALLOWED TO STAY UNTIL 800 PM.                                    DO NOT WEAR JEWERLY, MAKE UP. DO NOT WEAR LOTIONS, POWDERS, PERFUMES OR NAIL POLISH. DO NOT SHAVE FOR 48 HOURS PRIOR TO DAY OF SURGERY. MEN MAY SHAVE FACE AND NECK. CONTACTS, GLASSES, OR DENTURES MAY NOT BE WORN TO SURGERY.                                    [place] [facility] IS NOT RESPONSIBLE  FOR ANY BELONGINGS.                                                                    Mar[place] Kitchen            - Preparing for Surgery Before surgery, you can play an important role.  Because skin is not sterile, your skin needs to be as free of germs as possible.  You can reduce the number of germs on your skin by washing with CHG (chlorahexidine gluconate) soap before surgery.  CHG is an antiseptic cleaner which kills germs and bonds with the skin to continue killing germs even after washing. Please DO NOT use if you have an allergy to CHG or antibacterial soaps.  If your skin becomes reddened/irritated stop using the CHG and inform your nurse when you arrive at Short Stay. Do not shave (including legs and underarms) for at least 48 hours prior to the first CHG shower.  You may shave your face/neck. Please follow these instructions carefully:  1.  Shower with CHG  Soap the night before surgery and the  morning of Surgery.  2.  If you choose to wash your hair, wash your hair first as usual with your  normal  shampoo.  3.  After you shampoo, rinse your hair and body thoroughly to remove the  shampoo.                            4.  Use CHG as you would any other liquid soap.  You can apply chg directly  to the skin and wash                      Gently with a scrungie or clean washcloth.  5.  Apply the CHG Soap to your body ONLY  FROM THE NECK DOWN.   Do not use on face/ open                           Wound or open sores. Avoid contact with eyes, ears mouth and genitals (private parts).                       Wash face,  Genitals (private parts) with your normal soap.             6.  Wash thoroughly, paying special attention to the area where your surgery  will be performed.  7.  Thoroughly rinse your body with warm water from the neck down.  8.  DO NOT shower/wash with your normal soap after using and rinsing off  the CHG Soap.                9.  Pat yourself dry with a clean towel.            10.  Wear clean pajamas.            11.  Place clean sheets on your bed the night of your first shower and do not  sleep with pets. Day of Surgery : Do not apply any lotions/deodorants the morning of surgery.  Please wear clean clothes to the hospital/surgery center.  FAILURE TO FOLLOW THESE INSTRUCTIONS MAY RESULT IN THE CANCELLATION OF YOUR SURGERY PATIENT SIGNATURE_________________________________  NURSE SIGNATURE__________________________________  ________________________________________________________________________                                                        QUESTIONS Jennifer George PRE OP NURSE PHONE [phone].

## 2021-03-08 NOTE — Progress Notes (Addendum)
Spoke w/ via phone for pre-op interview---pt Lab needs dos----  none  per anesthesia surgery orders pending           Lab results------lab appt 03-20-2021  COVID test -----positve covid 01-16-2021 epic Arrive at -------70 am 03-22-2021 NPO after MN NO Solid Food.  Clear liquids from MN until---430 am then npo Med rec completed Medications to take morning of surgery -----allegra, flonase, levothyroxine, atorvastatin Diabetic medication -----n/a Patient instructed no nail polish to be worn day of surgery Patient instructed to bring photo id and insurance card day of surgery Patient aware to have Driver (ride ) / caregiver   spouse charlie  for 24 hours after surgery  Patient Special Instructions -----pt given extended stay instructions Pre-Op special Istructions ----note to dr bovard epic ib for surgery orders Patient verbalized understanding of instructions that were given at this phone interview. Patient denies shortness of breath, chest pain, fever, cough at this phone interview.   Medical clearance note brittany [place] fnp 02-10-2021 on chart for 03-22-2021 surgery  Ekg 09-12-2020 [facility] on chart Chest xray 09-12-2020 [facility] on chart  Hx of wpw had ablation with dr Caryl Comes 2010 no problems since Echo2-17-2020 epic  Lov dr Louis Meckel 03-08-2021 on chart follow up prn

## 2021-03-17 NOTE — H&P (Signed)
Jennifer George is an 70 y.o. female [phone] for LAVH/BS and cysto given endometrial hyperplasia.  Pt takes HRT and admits to not taking progesterone regularly.  Surgery has been postponed due to pt's covid infection.  Pt with other medical problems - hypothyroidism, PCKD (cleared by urology), also cleared by PCP.  Pt has had surgery for pelvic relaxation in past.  D/W pt r/b/a, process and expectations of surgery - questions answered.    Pertinent Gynecological History: OB [id number], SVD x 3 No abn pap, last 2019 HR HPV neg Endometrial biopsy - simple and focal complex endometrial hyperplasia, no atypia MMG 4/21 No STD     Menstrual History: No LMP recorded. Patient is postmenopausal.    Past Medical History:  Diagnosis Date   Acute kidney injury ([place])    07-20-2015   Arthritis    oa right hand and left foot   Complication of anesthesia    2010 heaRT ABLATION FOR WPW NO ISSUE SINCE   COPD (chronic obstructive pulmonary disease) ([facility])    beginnings of per pt   COVID 01/16/2021   asymptomatic   Hypothyroidism    Multicystic kidney    bilateral-- congenital  (right cyst drainage x2 by radiologist-- 07-20-2015 960m and 07-22-2015   PMB (postmenopausal bleeding)    PONV (postoperative nausea and vomiting)    needs scopolamine patch for nausea dos   Pulmonary nodule    left side  per CT 07-19-2015   Wears contact lenses    WPW (Wolff-Parkinson-White syndrome) last visit cardiologist dr kCaryl Comes2010--  per pt no issues or symptoms since   WITH LEFT POSTEROLATERAL ACCESORY PATHWAY-- S/P  ABLATION  11-23-2008    Past Surgical History:  Procedure Laterality Date   CARDIAC ELECTROPHYSIOLOGY STUDY AND ABLATION  11-23-2008   DR KCaryl Comes  Ablation left posterolateral accessory pathway   CYSTOSCOPY W/ RETROGRADES Right 08/28/2015   Procedure: CYSTOSCOPY WITH RETROGRADE PYELOGRAM;  Surgeon: SFranchot Gallo MD;  [facility]  Service: Urology;   Laterality: Right;   CYSTOSCOPY W/ URETERAL STENT PLACEMENT Right 07/28/2015   Procedure: CYSTOSCOPY WITH RETROGRADE PYELOGRAM/URETERAL STENT PLACEMENT;  Surgeon: SFranchot Gallo MD;  [facility]  Service: Urology;  Laterality: Right;   CYSTOSCOPY W/ URETERAL STENT REMOVAL Right 08/28/2015   Procedure: CYSTOSCOPY WITH STENT REMOVAL;  Surgeon: SFranchot Gallo MD;  [facility]  Service: Urology;  Laterality: Right;   DILATION AND CURETTAGE OF UTERUS  1981 & [place]  POSTERIOR AND ENTEROCELE REPAIR/  CARDINAL UTEROSACRAL COLPOSUSPENSION  06-29-2009   TRANSTHORACIC ECHOCARDIOGRAM  10-05-2008   normal LVF, ef 55-60%/  mild AV sclerosis and calcification without stenosis/  trivial MR and TR    FH: dementia  Social History:  reports that she has been smoking cigarettes. She has a 37.50 pack-year smoking history. She has never used smokeless tobacco. She reports current alcohol use. She reports that she does not use drugs. Married, retired - plays tennis  Allergies: No Known Allergies  Meds: Advil, Allegra, atorvastatin, diclofenac, estradiol, fluticasone, levothyroxine, methocarbamol, trazadone, Vit D and Vit D    Review of Systems  Constitutional: Negative.   HENT: Negative.    Respiratory: Negative.    Cardiovascular: Negative.   Gastrointestinal: Negative.   Genitourinary: Negative.   Musculoskeletal: Negative.   Skin: Negative.   Neurological: Negative.   Hematological: Negative.   Psychiatric/Behavioral: Negative.     There were no vitals taken  for this visit. Physical Exam Constitutional:      Appearance: Normal appearance.  HENT:     Head: Normocephalic and atraumatic.  Cardiovascular:     Rate and Rhythm: Normal rate and regular rhythm.  Pulmonary:     Effort: Pulmonary effort is normal.     Breath sounds: Normal breath sounds.  Abdominal:     General: Bowel sounds are normal.      Palpations: Abdomen is soft.  Musculoskeletal:        General: Normal range of motion.     Cervical back: Normal range of motion and neck supple.  Skin:    General: Skin is warm and dry.  Neurological:     General: No focal deficit present.     Mental Status: She is alert and oriented to person, place, and time.  Psychiatric:        Mood and Affect: Mood normal.        Behavior: Behavior normal.    EMB - simple and focal complex hyperplasia, no atypia 5/31 Covid CT scan Kidney cysts stable    Assessment/Plan: [id number] for LAVH/BS/cysto due to endometrial hyperplasia D/w pt r/b/a of surgery Will proceed  Jennifer George 03/17/2021, 6:20 PM

## 2021-03-20 ENCOUNTER — Other Ambulatory Visit: Payer: Self-pay

## 2021-03-20 ENCOUNTER — Encounter (HOSPITAL_COMMUNITY)
Admission: RE | Admit: 2021-03-20 | Discharge: 2021-03-20 | Disposition: A | Discharge status: Home / Self Care | Payer: Medicare HMO | Source: Ambulatory Visit | Attending: Obstetrics and Gynecology | Admitting: Obstetrics and Gynecology

## 2021-03-20 DIAGNOSIS — Z01812 Encounter for preprocedural laboratory examination: Secondary | ICD-10-CM | POA: Insufficient documentation

## 2021-03-20 LAB — CBC
HCT: 49.6 % — ABNORMAL HIGH (ref 36.0–46.0)
Hemoglobin: 16.2 g/dL — ABNORMAL HIGH (ref 12.0–15.0)
MCH: 30.6 pg (ref 26.0–34.0)
MCHC: 32.7 g/dL (ref 30.0–36.0)
MCV: 93.6 fL (ref 80.0–100.0)
Platelets: 358 10*3/uL (ref 150–400)
RBC: 5.3 MIL/uL — ABNORMAL HIGH (ref 3.87–5.11)
RDW: 14.2 % (ref 11.5–15.5)
WBC: 8.1 10*3/uL (ref 4.0–10.5)
nRBC: 0 % (ref 0.0–0.2)

## 2021-03-20 LAB — COMPREHENSIVE METABOLIC PANEL
ALT: 17 U/L (ref 0–44)
AST: 17 U/L (ref 15–41)
Albumin: 4.1 g/dL (ref 3.5–5.0)
Alkaline Phosphatase: 61 U/L (ref 38–126)
Anion gap: 12 (ref 5–15)
BUN: 21 mg/dL (ref 8–23)
CO2: 20 mmol/L — ABNORMAL LOW (ref 22–32)
Calcium: 10 mg/dL (ref 8.9–10.3)
Chloride: 104 mmol/L (ref 98–111)
Creatinine, Ser: 1.03 mg/dL — ABNORMAL HIGH (ref 0.44–1.00)
GFR, Estimated: 60 mL/min — ABNORMAL LOW (ref >60)
Glucose, Bld: 96 mg/dL (ref 70–99)
Potassium: 4.5 mmol/L (ref 3.5–5.1)
Sodium: 136 mmol/L (ref 135–145)
Total Bilirubin: 0.5 mg/dL (ref 0.3–1.2)
Total Protein: 6.8 g/dL (ref 6.5–8.1)

## 2021-03-21 NOTE — Anesthesia Preprocedure Evaluation (Addendum)
Anesthesia Evaluation  Patient identified by MRN, date of birth, ID band Patient awake    Reviewed: Allergy & Precautions, NPO status , Patient's Chart, lab work & pertinent test results  History of Anesthesia Complications (+) PONV  Airway Mallampati: II  TM Distance: >3 FB Neck ROM: Full    Dental no notable dental hx. (+) Teeth Intact, Dental Advisory Given   Pulmonary COPD, Current SmokerPatient did not abstain from smoking.,    Pulmonary exam normal breath sounds clear to auscultation       Cardiovascular Exercise Tolerance: Good Normal cardiovascular exam Rhythm:Regular Rate:Normal     Neuro/Psych negative neurological ROS     GI/Hepatic negative GI ROS, Neg liver ROS,   Endo/Other  Hypothyroidism   Renal/GU Lab Results      Component                Value               Date                      CREATININE               1.03 (H)            03/20/2021                BUN                      21                  03/20/2021                NA                       136                 03/20/2021                K                        4.5                 03/20/2021                CL                       104                 03/20/2021                CO2                      20 (L)              03/20/2021             Female GU complaint Endometrial hyperplasia    Musculoskeletal  (+) Arthritis ,   Abdominal   Peds  Hematology Lab Results      Component                Value               Date                      WBC  8.1                 03/20/2021                HGB                      16.2 (H)            03/20/2021                HCT                      49.6 (H)            03/20/2021                MCV                      93.6                03/20/2021                PLT                      358                 03/20/2021              Anesthesia Other Findings    Reproductive/Obstetrics                           Anesthesia Physical Anesthesia Plan  ASA: 2  Anesthesia Plan: General   Post-op Pain Management:    Induction: Intravenous  PONV Risk Score and Plan: Scopolamine patch - Pre-op, Midazolam, Dexamethasone, Ondansetron and Treatment may vary due to age or medical condition  Airway Management Planned: Oral ETT  Additional Equipment: None  Intra-op Plan:   Post-operative Plan: Extubation in OR  Informed Consent: I have reviewed the patients History and Physical, chart, labs and discussed the procedure including the risks, benefits and alternatives for the proposed anesthesia with the patient or authorized representative who has indicated his/her understanding and acceptance.     Dental advisory given  Plan Discussed with:   Anesthesia Plan Comments: (GA + lidocaine infusion+ ketamine )       Anesthesia Quick Evaluation

## 2021-03-22 ENCOUNTER — Observation Stay (HOSPITAL_BASED_OUTPATIENT_CLINIC_OR_DEPARTMENT_OTHER)
Admission: RE | Admit: 2021-03-22 | Discharge: 2021-03-22 | Disposition: A | Discharge status: Home / Self Care | Payer: Medicare HMO | Attending: Obstetrics and Gynecology | Admitting: Obstetrics and Gynecology

## 2021-03-22 ENCOUNTER — Encounter (HOSPITAL_BASED_OUTPATIENT_CLINIC_OR_DEPARTMENT_OTHER): Payer: Self-pay | Admitting: Obstetrics and Gynecology

## 2021-03-22 ENCOUNTER — Encounter (HOSPITAL_BASED_OUTPATIENT_CLINIC_OR_DEPARTMENT_OTHER)
Admission: RE | Disposition: A | Discharge status: Home / Self Care | Payer: Self-pay | Source: Home / Self Care | Attending: Obstetrics and Gynecology

## 2021-03-22 ENCOUNTER — Ambulatory Visit (HOSPITAL_BASED_OUTPATIENT_CLINIC_OR_DEPARTMENT_OTHER): Payer: Medicare HMO | Admitting: Anesthesiology

## 2021-03-22 ENCOUNTER — Other Ambulatory Visit: Payer: Self-pay

## 2021-03-22 DIAGNOSIS — F17210 Nicotine dependence, cigarettes, uncomplicated: Secondary | ICD-10-CM | POA: Insufficient documentation

## 2021-03-22 DIAGNOSIS — N840 Polyp of corpus uteri: Secondary | ICD-10-CM | POA: Diagnosis not present

## 2021-03-22 DIAGNOSIS — R69 Illness, unspecified: Secondary | ICD-10-CM | POA: Diagnosis not present

## 2021-03-22 DIAGNOSIS — N8500 Endometrial hyperplasia, unspecified: Secondary | ICD-10-CM | POA: Diagnosis not present

## 2021-03-22 DIAGNOSIS — J449 Chronic obstructive pulmonary disease, unspecified: Secondary | ICD-10-CM | POA: Insufficient documentation

## 2021-03-22 DIAGNOSIS — Z90710 Acquired absence of both cervix and uterus: Secondary | ICD-10-CM | POA: Diagnosis present

## 2021-03-22 DIAGNOSIS — N838 Other noninflammatory disorders of ovary, fallopian tube and broad ligament: Secondary | ICD-10-CM | POA: Insufficient documentation

## 2021-03-22 DIAGNOSIS — Z79899 Other long term (current) drug therapy: Secondary | ICD-10-CM | POA: Insufficient documentation

## 2021-03-22 DIAGNOSIS — N179 Acute kidney failure, unspecified: Secondary | ICD-10-CM | POA: Diagnosis not present

## 2021-03-22 DIAGNOSIS — N8501 Benign endometrial hyperplasia: Secondary | ICD-10-CM | POA: Diagnosis not present

## 2021-03-22 DIAGNOSIS — E039 Hypothyroidism, unspecified: Secondary | ICD-10-CM | POA: Diagnosis not present

## 2021-03-22 DIAGNOSIS — Z8616 Personal history of COVID-19: Secondary | ICD-10-CM | POA: Diagnosis not present

## 2021-03-22 DIAGNOSIS — N800 Endometriosis of uterus: Secondary | ICD-10-CM | POA: Diagnosis not present

## 2021-03-22 HISTORY — PX: CYSTOSCOPY: SHX5120

## 2021-03-22 HISTORY — PX: LAPAROSCOPIC VAGINAL HYSTERECTOMY WITH SALPINGECTOMY: SHX6680

## 2021-03-22 LAB — TYPE AND SCREEN
ABO/RH(D): A POS
Antibody Screen: NEGATIVE

## 2021-03-22 LAB — BASIC METABOLIC PANEL
Anion gap: 6 (ref 5–15)
BUN: 16 mg/dL (ref 8–23)
CO2: 24 mmol/L (ref 22–32)
Calcium: 9.5 mg/dL (ref 8.9–10.3)
Chloride: 108 mmol/L (ref 98–111)
Creatinine, Ser: 1.11 mg/dL — ABNORMAL HIGH (ref 0.44–1.00)
GFR, Estimated: 54 mL/min — ABNORMAL LOW (ref >60)
Glucose, Bld: 187 mg/dL — ABNORMAL HIGH (ref 70–99)
Potassium: 4.5 mmol/L (ref 3.5–5.1)
Sodium: 138 mmol/L (ref 135–145)

## 2021-03-22 LAB — CBC
HCT: 45.6 % (ref 36.0–46.0)
Hemoglobin: 15.3 g/dL — ABNORMAL HIGH (ref 12.0–15.0)
MCH: 30.8 pg (ref 26.0–34.0)
MCHC: 33.6 g/dL (ref 30.0–36.0)
MCV: 91.9 fL (ref 80.0–100.0)
Platelets: 354 10*3/uL (ref 150–400)
RBC: 4.96 MIL/uL (ref 3.87–5.11)
RDW: 14.3 % (ref 11.5–15.5)
WBC: 17.9 10*3/uL — ABNORMAL HIGH (ref 4.0–10.5)
nRBC: 0 % (ref 0.0–0.2)

## 2021-03-22 SURGERY — HYSTERECTOMY, VAGINAL, LAPAROSCOPY-ASSISTED, WITH SALPINGECTOMY
Anesthesia: General | Site: Bladder

## 2021-03-22 MED ORDER — POVIDONE-IODINE 10 % EX SWAB
2.0000 "application " | Freq: Once | CUTANEOUS | Status: AC
  Administered 2021-03-22: 2 via TOPICAL

## 2021-03-22 MED ORDER — IBUPROFEN 800 MG PO TABS: 800.0000 mg | ORAL_TABLET | Freq: Three times a day (TID) | ORAL | Status: DC | PRN

## 2021-03-22 MED ORDER — LACTATED RINGERS IV SOLN: INTRAVENOUS | Status: DC

## 2021-03-22 MED ORDER — KETOROLAC TROMETHAMINE 30 MG/ML IJ SOLN: 15.0000 mg | Freq: Once | INTRAMUSCULAR | Status: DC | PRN

## 2021-03-22 MED ORDER — OXYCODONE HCL 5 MG PO TABS: 5.0000 mg | ORAL_TABLET | Freq: Four times a day (QID) | ORAL | 0 refills | Status: DC | PRN

## 2021-03-22 MED ORDER — LIDOCAINE 2% (20 MG/ML) 5 ML SYRINGE
INTRAMUSCULAR | Status: DC | PRN
  Administered 2021-03-22: 1.5 mg/kg/h via INTRAVENOUS

## 2021-03-22 MED ORDER — KETAMINE HCL 50 MG/5ML IJ SOSY
PREFILLED_SYRINGE | INTRAMUSCULAR | Status: AC
  Filled 2021-03-22: qty 5

## 2021-03-22 MED ORDER — DEXAMETHASONE SODIUM PHOSPHATE 10 MG/ML IJ SOLN
INTRAMUSCULAR | Status: DC | PRN
  Administered 2021-03-22: 10 mg via INTRAVENOUS

## 2021-03-22 MED ORDER — SIMETHICONE 80 MG PO CHEW: 80.0000 mg | CHEWABLE_TABLET | Freq: Four times a day (QID) | ORAL | Status: DC | PRN

## 2021-03-22 MED ORDER — LIDOCAINE 2% (20 MG/ML) 5 ML SYRINGE
INTRAMUSCULAR | Status: DC | PRN
  Administered 2021-03-22: 60 mg via INTRAVENOUS

## 2021-03-22 MED ORDER — OXYCODONE HCL 5 MG PO TABS: 5.0000 mg | ORAL_TABLET | Freq: Once | ORAL | Status: DC | PRN

## 2021-03-22 MED ORDER — FENTANYL CITRATE (PF) 100 MCG/2ML IJ SOLN
INTRAMUSCULAR | Status: DC | PRN
  Administered 2021-03-22: 25 ug via INTRAVENOUS
  Administered 2021-03-22: 50 ug via INTRAVENOUS
  Administered 2021-03-22: 100 ug via INTRAVENOUS
  Administered 2021-03-22: 25 ug via INTRAVENOUS
  Administered 2021-03-22 (×2): 50 ug via INTRAVENOUS

## 2021-03-22 MED ORDER — AMISULPRIDE (ANTIEMETIC) 5 MG/2ML IV SOLN: 10.0000 mg | Freq: Once | INTRAVENOUS | Status: DC | PRN

## 2021-03-22 MED ORDER — PROPOFOL 10 MG/ML IV BOLUS
INTRAVENOUS | Status: AC
  Filled 2021-03-22: qty 20

## 2021-03-22 MED ORDER — DEXTROSE 5 % IV SOLN: 2000.0000 mg | Freq: Once | INTRAVENOUS | Status: DC

## 2021-03-22 MED ORDER — SODIUM CHLORIDE 0.9 % IV SOLN
2.0000 g | Freq: Once | INTRAVENOUS | Status: AC
  Administered 2021-03-22: 2 g via INTRAVENOUS
  Filled 2021-03-22: qty 2

## 2021-03-22 MED ORDER — ONDANSETRON HCL 4 MG/2ML IJ SOLN
INTRAMUSCULAR | Status: AC
  Filled 2021-03-22: qty 2

## 2021-03-22 MED ORDER — LIDOCAINE HCL (PF) 2 % IJ SOLN
INTRAMUSCULAR | Status: AC
  Filled 2021-03-22: qty 5

## 2021-03-22 MED ORDER — HYDROMORPHONE HCL 1 MG/ML IJ SOLN: 0.2500 mg | INTRAMUSCULAR | Status: DC | PRN

## 2021-03-22 MED ORDER — MIDAZOLAM HCL 2 MG/2ML IJ SOLN
INTRAMUSCULAR | Status: AC
  Filled 2021-03-22: qty 2

## 2021-03-22 MED ORDER — OXYCODONE-ACETAMINOPHEN 5-325 MG PO TABS
ORAL_TABLET | ORAL | Status: AC
  Filled 2021-03-22: qty 1

## 2021-03-22 MED ORDER — VASOPRESSIN 20 UNIT/ML IV SOLN
INTRAVENOUS | Status: DC | PRN
  Administered 2021-03-22: 20 [IU]

## 2021-03-22 MED ORDER — ONDANSETRON HCL 4 MG/2ML IJ SOLN
INTRAMUSCULAR | Status: DC | PRN
  Administered 2021-03-22: 4 mg via INTRAVENOUS

## 2021-03-22 MED ORDER — MENTHOL 3 MG MT LOZG: 1.0000 | LOZENGE | OROMUCOSAL | Status: DC | PRN

## 2021-03-22 MED ORDER — FENTANYL CITRATE (PF) 100 MCG/2ML IJ SOLN
INTRAMUSCULAR | Status: AC
  Filled 2021-03-22: qty 2

## 2021-03-22 MED ORDER — OXYCODONE HCL 5 MG/5ML PO SOLN: 5.0000 mg | Freq: Once | ORAL | Status: DC | PRN

## 2021-03-22 MED ORDER — KETOROLAC TROMETHAMINE 15 MG/ML IJ SOLN
INTRAMUSCULAR | Status: AC
  Filled 2021-03-22: qty 1

## 2021-03-22 MED ORDER — ATORVASTATIN CALCIUM 20 MG PO TABS: 20.0000 mg | ORAL_TABLET | Freq: Every day | ORAL | Status: DC

## 2021-03-22 MED ORDER — IBUPROFEN 200 MG PO TABS: 200.0000 mg | ORAL_TABLET | Freq: Three times a day (TID) | ORAL | 1 refills | Status: AC | PRN

## 2021-03-22 MED ORDER — ROCURONIUM BROMIDE 10 MG/ML (PF) SYRINGE
PREFILLED_SYRINGE | INTRAVENOUS | Status: DC | PRN
  Administered 2021-03-22: 10 mg via INTRAVENOUS
  Administered 2021-03-22: 50 mg via INTRAVENOUS

## 2021-03-22 MED ORDER — SODIUM CHLORIDE 0.9 % IV SOLN
INTRAVENOUS | Status: AC
  Filled 2021-03-22: qty 2

## 2021-03-22 MED ORDER — DEXAMETHASONE SODIUM PHOSPHATE 10 MG/ML IJ SOLN
INTRAMUSCULAR | Status: AC
  Filled 2021-03-22: qty 1

## 2021-03-22 MED ORDER — TRAZODONE HCL 100 MG PO TABS: 200.0000 mg | ORAL_TABLET | Freq: Every day | ORAL | Status: DC

## 2021-03-22 MED ORDER — SCOPOLAMINE 1 MG/3DAYS TD PT72
MEDICATED_PATCH | TRANSDERMAL | Status: AC
  Filled 2021-03-22: qty 1

## 2021-03-22 MED ORDER — ALUM & MAG HYDROXIDE-SIMETH 200-200-20 MG/5ML PO SUSP: 30.0000 mL | ORAL | Status: DC | PRN

## 2021-03-22 MED ORDER — MIDAZOLAM HCL 5 MG/5ML IJ SOLN
INTRAMUSCULAR | Status: DC | PRN
  Administered 2021-03-22: 2 mg via INTRAVENOUS

## 2021-03-22 MED ORDER — BUPIVACAINE HCL (PF) 0.25 % IJ SOLN
INTRAMUSCULAR | Status: DC | PRN
  Administered 2021-03-22: 13 mL

## 2021-03-22 MED ORDER — ONDANSETRON HCL 4 MG/2ML IJ SOLN
4.0000 mg | Freq: Four times a day (QID) | INTRAMUSCULAR | Status: DC | PRN
  Administered 2021-03-22: 4 mg via INTRAVENOUS

## 2021-03-22 MED ORDER — LEVOTHYROXINE SODIUM 88 MCG PO TABS: 88.0000 ug | ORAL_TABLET | Freq: Every day | ORAL | Status: DC

## 2021-03-22 MED ORDER — KETOROLAC TROMETHAMINE 30 MG/ML IJ SOLN
INTRAMUSCULAR | Status: AC
  Filled 2021-03-22: qty 1

## 2021-03-22 MED ORDER — ONDANSETRON HCL 4 MG PO TABS: 4.0000 mg | ORAL_TABLET | Freq: Four times a day (QID) | ORAL | Status: DC | PRN

## 2021-03-22 MED ORDER — KETAMINE HCL 10 MG/ML IJ SOLN
INTRAMUSCULAR | Status: DC | PRN
  Administered 2021-03-22: 30 mg via INTRAVENOUS

## 2021-03-22 MED ORDER — SUGAMMADEX SODIUM 200 MG/2ML IV SOLN
INTRAVENOUS | Status: DC | PRN
  Administered 2021-03-22: 150 mg via INTRAVENOUS

## 2021-03-22 MED ORDER — KETOROLAC TROMETHAMINE 30 MG/ML IJ SOLN
INTRAMUSCULAR | Status: DC | PRN
  Administered 2021-03-22: 15 mg via INTRAVENOUS

## 2021-03-22 MED ORDER — ONDANSETRON HCL 4 MG/2ML IJ SOLN: 4.0000 mg | Freq: Once | INTRAMUSCULAR | Status: DC | PRN

## 2021-03-22 MED ORDER — FLUTICASONE PROPIONATE 50 MCG/ACT NA SUSP: 2.0000 | NASAL | Status: DC | PRN

## 2021-03-22 MED ORDER — SCOPOLAMINE 1 MG/3DAYS TD PT72
1.0000 | MEDICATED_PATCH | TRANSDERMAL | Status: DC
  Administered 2021-03-22: 1.5 mg via TRANSDERMAL

## 2021-03-22 MED ORDER — KETOROLAC TROMETHAMINE 15 MG/ML IJ SOLN
15.0000 mg | Freq: Once | INTRAMUSCULAR | Status: AC
  Administered 2021-03-22: 15 mg via INTRAVENOUS

## 2021-03-22 MED ORDER — GUAIFENESIN 100 MG/5ML PO SOLN: 15.0000 mL | ORAL | Status: DC | PRN

## 2021-03-22 MED ORDER — MORPHINE SULFATE (PF) 4 MG/ML IV SOLN: 1.0000 mg | INTRAVENOUS | Status: DC | PRN

## 2021-03-22 MED ORDER — PROPOFOL 10 MG/ML IV BOLUS
INTRAVENOUS | Status: DC | PRN
  Administered 2021-03-22: 110 mg via INTRAVENOUS

## 2021-03-22 MED ORDER — SODIUM CHLORIDE 0.9 % IR SOLN
Status: DC | PRN
  Administered 2021-03-22: 1000 mL via INTRAVESICAL

## 2021-03-22 MED ORDER — ROCURONIUM BROMIDE 10 MG/ML (PF) SYRINGE
PREFILLED_SYRINGE | INTRAVENOUS | Status: AC
  Filled 2021-03-22: qty 10

## 2021-03-22 MED ORDER — OXYCODONE-ACETAMINOPHEN 5-325 MG PO TABS
1.0000 | ORAL_TABLET | ORAL | Status: DC | PRN
  Administered 2021-03-22: 1 via ORAL

## 2021-03-22 MED ORDER — SODIUM CHLORIDE FLUSH 0.9 % IV SOLN
INTRAVENOUS | Status: DC | PRN
  Administered 2021-03-22: 100 mL

## 2021-03-22 SURGICAL SUPPLY — 49 items
ADH SKN CLS APL DERMABOND .7 (GAUZE/BANDAGES/DRESSINGS) ×2
APL SRG 38 LTWT LNG FL B (MISCELLANEOUS)
APPLICATOR ARISTA FLEXITIP XL (MISCELLANEOUS) IMPLANT
CABLE HIGH FREQUENCY MONO STRZ (ELECTRODE) IMPLANT
CNTNR URN SCR LID CUP LEK RST (MISCELLANEOUS) ×2 IMPLANT
CONT SPEC 4OZ STRL OR WHT (MISCELLANEOUS) ×3
COVER BACK TABLE 60X90IN (DRAPES) ×3 IMPLANT
COVER MAYO STAND STRL (DRAPES) ×6 IMPLANT
DECANTER SPIKE VIAL GLASS SM (MISCELLANEOUS) IMPLANT
DERMABOND ADVANCED (GAUZE/BANDAGES/DRESSINGS) ×1
DERMABOND ADVANCED .7 DNX12 (GAUZE/BANDAGES/DRESSINGS) ×2 IMPLANT
DRSG COVADERM PLUS 2X2 (GAUZE/BANDAGES/DRESSINGS) IMPLANT
DRSG OPSITE POSTOP 3X4 (GAUZE/BANDAGES/DRESSINGS) IMPLANT
DURAPREP 26ML APPLICATOR (WOUND CARE) ×3 IMPLANT
ELECT REM PT RETURN 9FT ADLT (ELECTROSURGICAL)
ELECTRODE REM PT RTRN 9FT ADLT (ELECTROSURGICAL) IMPLANT
GAUZE 4X4 16PLY ~~LOC~~+RFID DBL (SPONGE) ×9 IMPLANT
GLOVE SURG ENC MOIS LTX SZ6.5 (GLOVE) ×9 IMPLANT
GLOVE SURG UNDER POLY LF SZ6.5 (GLOVE) ×12 IMPLANT
GLOVE SURG UNDER POLY LF SZ7 (GLOVE) ×4 IMPLANT
GLOVE SURG UNDER POLY LF SZ7.5 (GLOVE) ×6 IMPLANT
HEMOSTAT ARISTA ABSORB 3G PWDR (HEMOSTASIS) IMPLANT
HIBICLENS CHG 4% 4OZ (MISCELLANEOUS) ×3 IMPLANT
KIT TURNOVER CYSTO (KITS) ×3 IMPLANT
NEEDLE INSUFFLATION 120MM (ENDOMECHANICALS) ×3 IMPLANT
NS IRRIG 1000ML POUR BTL (IV SOLUTION) ×3 IMPLANT
PACK LAVH (CUSTOM PROCEDURE TRAY) ×3 IMPLANT
PACK ROBOTIC GOWN (GOWN DISPOSABLE) ×3 IMPLANT
PACK TRENDGUARD 450 HYBRID PRO (MISCELLANEOUS) IMPLANT
PAD OB MATERNITY 4.3X12.25 (PERSONAL CARE ITEMS) ×3 IMPLANT
PROTECTOR NERVE ULNAR (MISCELLANEOUS) ×6 IMPLANT
SET IRRIG Y TYPE TUR BLADDER L (SET/KITS/TRAYS/PACK) ×3 IMPLANT
SET SUCTION IRRIG HYDROSURG (IRRIGATION / IRRIGATOR) IMPLANT
SET TUBE SMOKE EVAC HIGH FLOW (TUBING) ×3 IMPLANT
SHEARS HARMONIC ACE PLUS 36CM (ENDOMECHANICALS) ×3 IMPLANT
SPONGE T-LAP 4X18 ~~LOC~~+RFID (SPONGE) ×5 IMPLANT
SUT VIC AB 1 CT1 18XBRD ANBCTR (SUTURE) ×2 IMPLANT
SUT VIC AB 1 CT1 8-18 (SUTURE) ×3
SUT VIC AB 2-0 CT1 (SUTURE) ×2 IMPLANT
SUT VIC AB 3-0 SH 27 (SUTURE)
SUT VIC AB 3-0 SH 27X BRD (SUTURE) IMPLANT
SUT VIC AB 4-0 PS2 27 (SUTURE) ×3 IMPLANT
SUT VICRYL 0 TIES 12 18 (SUTURE) ×2 IMPLANT
SUT VICRYL 0 UR6 27IN ABS (SUTURE) IMPLANT
TOWEL OR 17X26 10 PK STRL BLUE (TOWEL DISPOSABLE) ×3 IMPLANT
TRAY FOLEY W/BAG SLVR 14FR LF (SET/KITS/TRAYS/PACK) ×3 IMPLANT
TRENDGUARD 450 HYBRID PRO PACK (MISCELLANEOUS)
TROCAR BLADELESS OPT 5 100 (ENDOMECHANICALS) ×9 IMPLANT
WARMER LAPAROSCOPE (MISCELLANEOUS) ×3 IMPLANT

## 2021-03-22 NOTE — Progress Notes (Signed)
Day of Surgery Procedure(s) (LRB): LAPAROSCOPIC ASSISTED VAGINAL HYSTERECTOMY WITH SALPINGECTOMY (Bilateral) CYSTOSCOPY (N/A)  Subjective: Patient reports incisional pain, tolerating PO, and no problems voiding.  Pain controlled.  States pain like cramping, not too severe  Objective: I have reviewed patient's vital signs, intake and output, and medications.  General: alert and no distress Resp: clear to auscultation bilaterally Cardio: regular rate and rhythm GI: soft, non-tender; bowel sounds normal; no masses,  no organomegaly Extremities: extremities normal, atraumatic, no cyanosis or edema Inc: C/D/I  Assessment: s/p Procedure(s): LAPAROSCOPIC ASSISTED VAGINAL HYSTERECTOMY WITH SALPINGECTOMY (Bilateral) CYSTOSCOPY (N/A): stable and progressing well  Plan: Encourage ambulation Pt desires d/c home this PM, will check labs at 4pm - likely d/c after.    LOS: 0 days    Champayne Kocian Bovard-Stuckert 03/22/2021, 1:12 PM

## 2021-03-22 NOTE — Discharge Summary (Signed)
Physician Discharge Summary  Patient ID: Jennifer George MRN: [id number] DOB/AGE: 1951-04-27 70 y.o.  Admit date: 03/22/2021 Discharge date: 03/22/2021  Admission Diagnoses:  Discharge Diagnoses:  Principal Problem:   S/P laparoscopic assisted vaginal hysterectomy (LAVH) Active Problems:   Endometrial hyperplasia   Discharged Condition: good  Hospital Course: Pt admitted for LAVH due to endometrial hyperplasia.  Underwent with out complication.  Ambulating, Voiding, tolerating po and pain controlled per pt request d/c home  Consults: None  Significant Diagnostic Studies: labs: CBC, BMP  Treatments: IV hydration, antibiotics: Ancef, and analgesia: acetaminophen w/ codeine and Motrin/toradol  Discharge Exam: Blood pressure (!) 151/93, pulse 78, temperature 97.9 F (36.6 C), temperature source Oral, resp. rate 18, height '5\' 8"'$  (1.727 m), weight 62.1 kg, SpO2 94 %. General appearance: alert, cooperative, and no distress Resp: clear to auscultation bilaterally Cardio: regular rate and rhythm GI: soft, non-tender; bowel sounds normal; no masses,  no organomegaly Extremities: extremities normal, atraumatic, no cyanosis or edema Incision/Wound:C/D/I  Disposition: Discharge disposition: 01-Home or Self Care       Discharge Instructions     Call MD for:  persistant nausea and vomiting   Complete by: As directed    Call MD for:  redness, tenderness, or signs of infection (pain, swelling, redness, odor or green/yellow discharge around incision site)   Complete by: As directed    Call MD for:  severe uncontrolled pain   Complete by: As directed    Diet - low sodium heart healthy   Complete by: As directed    Discharge instructions   Complete by: As directed    Call [phone] with questions or problems   Discharge wound care:   Complete by: As directed    Dermabond (glue) may peel from incisions   Driving Restrictions   Complete by: As directed    While taking strong  pain medicine   Increase activity slowly   Complete by: As directed    Lifting restrictions   Complete by: As directed    No greater than 10-15lbs for 6 weeks   May shower / Bathe   Complete by: As directed    May walk up steps   Complete by: As directed    Sexual Activity Restrictions   Complete by: As directed    Pelvic rest - no douching, tampons or sex for sex for 6 weeks      Allergies as of 03/22/2021   No Known Allergies      Medication List     TAKE these medications    Advil Allergy Sinus 2-30-200 MG Tabs Generic drug: Chlorpheniramine-PSE-Ibuprofen Take by mouth as needed.   atorvastatin 20 MG tablet Commonly known as: LIPITOR Take 20 mg by mouth daily.   DICLOFENAC PO Take 75 mg by mouth daily. Takes 75 mg in pm prn also   estradiol 0.05 MG/24HR patch Commonly known as: VIVELLE-DOT Place 1 patch onto the skin 2 (two) times a week. Tues and Saturday.   fexofenadine 180 MG tablet Commonly known as: ALLEGRA Take 180 mg by mouth daily.   fluticasone 50 MCG/ACT nasal spray Commonly known as: FLONASE Place 2 sprays into both nostrils as needed.   ibuprofen 200 MG tablet Commonly known as: ADVIL Take 1 tablet (200 mg total) by mouth every 8 (eight) hours as needed for moderate pain. What changed:  when to take this reasons to take this   levothyroxine 88 MCG tablet Commonly known as: SYNTHROID Take 88 mcg by mouth daily before  breakfast. ON Sunday'S TAKES TWO TABLETS   MIRALAX PO Take by mouth. 1 capful daily   oxyCODONE 5 MG immediate release tablet Commonly known as: Roxicodone Take 1-2 tablets (5-10 mg total) by mouth every 6 (six) hours as needed for severe pain.   PROBIOTIC PO Take by mouth daily. [facility]   traZODone 100 MG tablet Commonly known as: DESYREL Take 200-300 mg by mouth at bedtime.   TYLENOL ALLERGY SINUS PO Take by mouth.   vitamin B-12 1000 MCG tablet Commonly known as: CYANOCOBALAMIN Take 1,000 mcg by mouth daily.    vitamin C 1000 MG tablet Take 1,000 mg by mouth daily.   VITAMIN D PO Take by mouth. 1000 iu daily d 3               Discharge Care Instructions  (From admission, onward)           Start     Ordered   03/22/21 0000  Discharge wound care:       Comments: Dermabond (glue) may peel from incisions   03/22/21 1724            Follow-up Information     Bovard-Stuckert, Hoang Pettingill, MD. Schedule an appointment as soon as possible for a visit in 2 week(s).   Specialty: Obstetrics and Gynecology Why: for incision check, 6wks for post op exam Contact information: [place] [id number] [phone]                 Signed: Janyth Contes 03/22/2021, 5:25 PM

## 2021-03-22 NOTE — Progress Notes (Signed)
Day of Surgery Procedure(s) (LRB): LAPAROSCOPIC ASSISTED VAGINAL HYSTERECTOMY WITH SALPINGECTOMY (Bilateral) CYSTOSCOPY (N/A)  Subjective: Patient reports incisional pain, tolerating PO, and no problems voiding.  Tol po.  Pain controlled  Objective: I have reviewed patient's vital signs, intake and output, medications, and labs.  General: alert, cooperative, and no distress Resp: clear to auscultation bilaterally Cardio: regular rate and rhythm GI: soft, non-tender; bowel sounds normal; no masses,  no organomegaly Extremities: extremities normal, atraumatic, no cyanosis or edema Inc: C/D/I  Assessment: s/p Procedure(s): LAPAROSCOPIC ASSISTED VAGINAL HYSTERECTOMY WITH SALPINGECTOMY (Bilateral) CYSTOSCOPY (N/A): stable and progressing well  Plan: Encourage ambulation Discharge home with motrin '800mg'$  per pt and oxycodone F/u 2 and 6 weeks.     LOS: 0 days    Halcyon Heck Bovard-Stuckert 03/22/2021, 5:13 PM

## 2021-03-22 NOTE — Op Note (Signed)
NAMEMIRA, ROMIG MEDICAL RECORD NO: [id number] ACCOUNT NO: [id number] DATE OF BIRTH: 08/15/1951 FACILITY: [facility] LOCATION: [facility] PHYSICIAN: Janyth Contes, MD  Operative Report   DATE OF PROCEDURE: 03/22/2021  PREOPERATIVE DIAGNOSIS:  Endometrial hyperplasia.  POSTOPERATIVE DIAGNOSIS:  Endometrial hyperplasia.  PROCEDURE:  Laparoscopic-assisted vaginal hysterectomy with bilateral salpingectomy as well as cystoscopy.  SURGEON:  Janyth Contes, MD  ASSISTANT:  Eula Flax, MD  ANESTHESIA:  Local and general.  ESTIMATED BLOOD LOSS:  Approximately 100 mL.  IV FLUID AND URINE OUTPUT:  Per anesthesia notes.  COMPLICATIONS:  None.  PATHOLOGY:  Uterus, cervix and bilateral fallopian tubes.  DESCRIPTION OF PROCEDURE:  After informed consent was reviewed with the patient including risks, benefits and alternatives of the surgical procedure, she was transported to the OR, placed on the table in supine position.  General anesthesia was induced  and found to be adequate.  She was then placed in the [place] stirrups, prepped and draped in normal sterile fashion.  After an appropriate timeout, her Foley catheter was sterilely placed and a Hulka manipulator was placed in her uterus.  Gloves and  gown were changed.  Attention was turned to the abdominal portion of the case.  An approximately 5 mm infraumbilical incision was made using Veress needle.  Pneumoperitoneum was obtained after passing the hanging drop test with an opening pressure of 0  mmHg.  The accessory ports were placed, the 5 mm accessory port placed on the right and left after a 5 mm port had been placed directly at the umbilicus and a brief pelvic survey performed, revealing small postmenopausal uterus and ovaries.  Normal  tubes.  The fimbriated end of the tubes was grasped on the patient's left side.  The tube was excised using Harmonic scalpel to the level of the cornu.  The cardinal ligaments and  round ligament were then ligated with Harmonic scalpel to the level of the  uterine artery.  Bladder flap was created to the midline.  Attention was then turned to the right side, which in a similar fashion was grasped at the fimbriated end of the tube.  The tube was excised using Harmonic scalpel.  The round and cardinal  ligaments were ligated with Harmonic scalpel as well to the level of the bladder flap, it was connected with the other side.  Attention was turned to the vaginal portion of the case.  A heavy weighted speculum was placed in the vagina as well as a  Hotel manager.  The uterus was grasped with Ardis Hughs tenaculum and injected with vasopressin and using Bovie cautery circumscribed.  The posterior cul-de-sac was entered sharply.  A banana speculum was placed.  The anterior cul-de-sac was also entered sharply.   The uterosacral ligaments were plicated and held on both sides in a similar fashion to the level of the pedicles, were created to the level of the uterine artery.  The uterus was then delivered through the vagina.  Bleeding at the pedicles was controlled  with an extra suture.  The mucosa was reapproximated with 2-0 Vicryl after the uterosacral ligaments were plicated. The held sutures were then tied together.  A cystoscopy performed, revealed jets from bilateral ureters.  Gloves and gown were changed  and attention was turned back to the abdominal portion of the case.  A survey performed revealed hemostasis.  The ports were removed under direct visualization and closed with 3-0 Vicryl and Dermabond.  Sponge, lap and needle counts were correct x2 per  the operating staff.  SHW D: 03/22/2021 9:28:09 am T: 03/22/2021 10:02:00 am  JOB: [id number]

## 2021-03-22 NOTE — Brief Op Note (Signed)
03/22/2021  9:21 AM  PATIENT:  Maryann Conners  70 y.o. female  PRE-OPERATIVE DIAGNOSIS:  endometrial hyperplasia  POST-OPERATIVE DIAGNOSIS:  endometrial hyperplasia  PROCEDURE:  Procedure(s): LAPAROSCOPIC ASSISTED VAGINAL HYSTERECTOMY WITH SALPINGECTOMY (Bilateral) CYSTOSCOPY (N/A)  SURGEON:  Surgeon(s) and Role:    * Bovard-Stuckert, Nuala Chiles, MD - Primary    * Shivaji, Melida Quitter, MD - Assisting  ANESTHESIA:   local and general  EBL:  100 mL IVF and uop per anesthesia  BLOOD ADMINISTERED:none  DRAINS: Urinary Catheter (Foley)   LOCAL MEDICATIONS USED:  MARCAINE    and OTHER vasopressin  SPECIMEN:  Source of Specimen:  uterus, cervix and B fallopian tubes  DISPOSITION OF SPECIMEN:  PATHOLOGY  COUNTS:  YES  TOURNIQUET:  * No tourniquets in log *  DICTATION: .Other Dictation: Dictation Number [id number]  PLAN OF CARE: Admit for overnight observation  PATIENT DISPOSITION:  PACU - hemodynamically stable.   Delay start of Pharmacological VTE agent (>24hrs) due to surgical blood loss or risk of bleeding: not applicable

## 2021-03-22 NOTE — Transfer of Care (Signed)
Immediate Anesthesia Transfer of Care Note  Patient: Jennifer George  Procedure(s) Performed: LAPAROSCOPIC ASSISTED VAGINAL HYSTERECTOMY WITH SALPINGECTOMY (Bilateral: Abdomen) CYSTOSCOPY (Bladder)  Patient Location: PACU  Anesthesia Type:General  Level of Consciousness: awake, alert  and oriented  Airway & Oxygen Therapy: Patient Spontanous Breathing and Patient connected to nasal cannula oxygen  Post-op Assessment: Report given to RN and Post -op Vital signs reviewed and stable  Post vital signs: Reviewed and stable  Last Vitals:  Vitals Value Taken Time  BP 172/89 03/22/21 0923  Temp    Pulse 93 03/22/21 0928  Resp 18 03/22/21 0929  SpO2 99 % 03/22/21 0928  Vitals shown include unvalidated device data.  Last Pain:  Vitals:   03/22/21 0601  TempSrc: Oral  PainSc: 0-No pain         Complications: No notable events documented.

## 2021-03-22 NOTE — Interval H&P Note (Signed)
History and Physical Interval Note:  03/22/2021 7:00 AM  Jennifer George  has presented today for surgery, with the diagnosis of endometrial hyperplasia.  The various methods of treatment have been discussed with the patient and family. After consideration of risks, benefits and other options for treatment, the patient has consented to  Procedure(s): LAPAROSCOPIC ASSISTED VAGINAL HYSTERECTOMY WITH SALPINGECTOMY (Bilateral) CYSTOSCOPY (N/A) as a surgical intervention.  The patient's history has been reviewed, patient examined, no change in status, stable for surgery.  I have reviewed the patient's chart and labs.  Questions were answered to the patient's satisfaction.     [name]

## 2021-03-22 NOTE — Anesthesia Procedure Notes (Signed)
Procedure Name: Intubation Date/Time: 03/22/2021 7:33 AM Performed by: Blaire Hodsdon D, CRNA Pre-anesthesia Checklist: Patient identified, Emergency Drugs available, Suction available and Patient being monitored Patient Re-evaluated:Patient Re-evaluated prior to induction Oxygen Delivery Method: Circle system utilized Preoxygenation: Pre-oxygenation with 100% oxygen Induction Type: IV induction Ventilation: Mask ventilation without difficulty Laryngoscope Size: Mac and 3 Grade View: Grade I Tube type: Oral Tube size: 7.0 mm Number of attempts: 1 Airway Equipment and Method: Stylet and Oral airway Placement Confirmation: ETT inserted through vocal cords under direct vision, positive ETCO2 and breath sounds checked- equal and bilateral Secured at: 21 cm Tube secured with: Tape Dental Injury: Teeth and Oropharynx as per pre-operative assessment

## 2021-03-22 NOTE — Anesthesia Postprocedure Evaluation (Signed)
Anesthesia Post Note  Patient: Jennifer George  Procedure(s) Performed: LAPAROSCOPIC ASSISTED VAGINAL HYSTERECTOMY WITH SALPINGECTOMY (Bilateral: Abdomen) CYSTOSCOPY (Bladder)     Patient location during evaluation: PACU Anesthesia Type: General Level of consciousness: awake and alert Pain management: pain level controlled Vital Signs Assessment: post-procedure vital signs reviewed and stable Respiratory status: spontaneous breathing, nonlabored ventilation, respiratory function stable and patient connected to nasal cannula oxygen Cardiovascular status: blood pressure returned to baseline and stable Postop Assessment: no apparent nausea or vomiting Anesthetic complications: no   No notable events documented.  Last Vitals:  Vitals:   03/22/21 1000 03/22/21 1034  BP: (!) 167/89 (!) 163/79  Pulse: 83 76  Resp: 20 18  Temp:  36.4 C  SpO2: 96% 94%    Last Pain:  Vitals:   03/22/21 1034  TempSrc: Oral  PainSc: [place]

## 2021-03-23 ENCOUNTER — Encounter (HOSPITAL_BASED_OUTPATIENT_CLINIC_OR_DEPARTMENT_OTHER): Payer: Self-pay | Admitting: Obstetrics and Gynecology

## 2021-03-27 LAB — SURGICAL PATHOLOGY

## 2021-06-07 DIAGNOSIS — H2513 Age-related nuclear cataract, bilateral: Secondary | ICD-10-CM | POA: Diagnosis not present

## 2021-06-07 DIAGNOSIS — H5203 Hypermetropia, bilateral: Secondary | ICD-10-CM | POA: Diagnosis not present

## 2021-06-07 DIAGNOSIS — Z0100 Encounter for examination of eyes and vision without abnormal findings: Secondary | ICD-10-CM | POA: Diagnosis not present

## 2021-08-23 DIAGNOSIS — Z780 Asymptomatic menopausal state: Secondary | ICD-10-CM | POA: Diagnosis not present

## 2021-08-23 DIAGNOSIS — Z79890 Hormone replacement therapy (postmenopausal): Secondary | ICD-10-CM | POA: Diagnosis not present

## 2021-10-12 DIAGNOSIS — H00021 Hordeolum internum right upper eyelid: Secondary | ICD-10-CM | POA: Diagnosis not present

## 2021-10-18 DIAGNOSIS — Z0100 Encounter for examination of eyes and vision without abnormal findings: Secondary | ICD-10-CM | POA: Diagnosis not present

## 2021-11-30 DIAGNOSIS — Z85828 Personal history of other malignant neoplasm of skin: Secondary | ICD-10-CM | POA: Diagnosis not present

## 2021-11-30 DIAGNOSIS — C44311 Basal cell carcinoma of skin of nose: Secondary | ICD-10-CM | POA: Diagnosis not present

## 2021-12-11 DIAGNOSIS — E785 Hyperlipidemia, unspecified: Secondary | ICD-10-CM | POA: Diagnosis not present

## 2021-12-11 DIAGNOSIS — E039 Hypothyroidism, unspecified: Secondary | ICD-10-CM | POA: Diagnosis not present

## 2021-12-17 DIAGNOSIS — Z1339 Encounter for screening examination for other mental health and behavioral disorders: Secondary | ICD-10-CM | POA: Diagnosis not present

## 2021-12-17 DIAGNOSIS — M1990 Unspecified osteoarthritis, unspecified site: Secondary | ICD-10-CM | POA: Diagnosis not present

## 2021-12-17 DIAGNOSIS — Z1331 Encounter for screening for depression: Secondary | ICD-10-CM | POA: Diagnosis not present

## 2021-12-17 DIAGNOSIS — E039 Hypothyroidism, unspecified: Secondary | ICD-10-CM | POA: Diagnosis not present

## 2021-12-17 DIAGNOSIS — Z720 Tobacco use: Secondary | ICD-10-CM | POA: Diagnosis not present

## 2021-12-17 DIAGNOSIS — J449 Chronic obstructive pulmonary disease, unspecified: Secondary | ICD-10-CM | POA: Diagnosis not present

## 2021-12-17 DIAGNOSIS — R82998 Other abnormal findings in urine: Secondary | ICD-10-CM | POA: Diagnosis not present

## 2021-12-17 DIAGNOSIS — G4700 Insomnia, unspecified: Secondary | ICD-10-CM | POA: Diagnosis not present

## 2021-12-17 DIAGNOSIS — E785 Hyperlipidemia, unspecified: Secondary | ICD-10-CM | POA: Diagnosis not present

## 2021-12-17 DIAGNOSIS — Z0000 Encounter for general adult medical examination without abnormal findings: Secondary | ICD-10-CM | POA: Diagnosis not present

## 2022-01-08 DIAGNOSIS — C44311 Basal cell carcinoma of skin of nose: Secondary | ICD-10-CM | POA: Diagnosis not present

## 2022-02-05 ENCOUNTER — Other Ambulatory Visit: Payer: Self-pay | Admitting: Internal Medicine

## 2022-02-05 DIAGNOSIS — Z1231 Encounter for screening mammogram for malignant neoplasm of breast: Secondary | ICD-10-CM

## 2022-02-14 ENCOUNTER — Ambulatory Visit: Payer: Medicare HMO

## 2022-02-28 ENCOUNTER — Ambulatory Visit
Admission: RE | Admit: 2022-02-28 | Discharge: 2022-02-28 | Disposition: A | Discharge status: Home / Self Care | Payer: Medicare HMO | Source: Ambulatory Visit | Attending: Internal Medicine | Admitting: Internal Medicine

## 2022-02-28 DIAGNOSIS — Z1231 Encounter for screening mammogram for malignant neoplasm of breast: Secondary | ICD-10-CM | POA: Diagnosis not present

## 2022-04-02 DIAGNOSIS — D224 Melanocytic nevi of scalp and neck: Secondary | ICD-10-CM | POA: Diagnosis not present

## 2022-04-02 DIAGNOSIS — L820 Inflamed seborrheic keratosis: Secondary | ICD-10-CM | POA: Diagnosis not present

## 2022-04-02 DIAGNOSIS — D2262 Melanocytic nevi of left upper limb, including shoulder: Secondary | ICD-10-CM | POA: Diagnosis not present

## 2022-04-02 DIAGNOSIS — D045 Carcinoma in situ of skin of trunk: Secondary | ICD-10-CM | POA: Diagnosis not present

## 2022-04-02 DIAGNOSIS — D225 Melanocytic nevi of trunk: Secondary | ICD-10-CM | POA: Diagnosis not present

## 2022-04-02 DIAGNOSIS — L570 Actinic keratosis: Secondary | ICD-10-CM | POA: Diagnosis not present

## 2022-04-02 DIAGNOSIS — D485 Neoplasm of uncertain behavior of skin: Secondary | ICD-10-CM | POA: Diagnosis not present

## 2022-04-02 DIAGNOSIS — C441191 Basal cell carcinoma of skin of left upper eyelid, including canthus: Secondary | ICD-10-CM | POA: Diagnosis not present

## 2022-04-02 DIAGNOSIS — L821 Other seborrheic keratosis: Secondary | ICD-10-CM | POA: Diagnosis not present

## 2022-04-02 DIAGNOSIS — Z85828 Personal history of other malignant neoplasm of skin: Secondary | ICD-10-CM | POA: Diagnosis not present

## 2022-05-09 DIAGNOSIS — C441191 Basal cell carcinoma of skin of left upper eyelid, including canthus: Secondary | ICD-10-CM | POA: Diagnosis not present

## 2022-05-16 DIAGNOSIS — L988 Other specified disorders of the skin and subcutaneous tissue: Secondary | ICD-10-CM | POA: Diagnosis not present

## 2022-05-16 DIAGNOSIS — D045 Carcinoma in situ of skin of trunk: Secondary | ICD-10-CM | POA: Diagnosis not present

## 2022-05-16 DIAGNOSIS — D485 Neoplasm of uncertain behavior of skin: Secondary | ICD-10-CM | POA: Diagnosis not present

## 2022-08-15 DIAGNOSIS — H5203 Hypermetropia, bilateral: Secondary | ICD-10-CM | POA: Diagnosis not present

## 2022-10-03 ENCOUNTER — Ambulatory Visit: Payer: Medicare HMO | Admitting: Podiatry

## 2022-10-03 DIAGNOSIS — M778 Other enthesopathies, not elsewhere classified: Secondary | ICD-10-CM

## 2022-10-03 DIAGNOSIS — M19072 Primary osteoarthritis, left ankle and foot: Secondary | ICD-10-CM

## 2022-10-03 NOTE — Progress Notes (Addendum)
Subjective:  Patient ID: Jennifer George, female    DOB: 12-17-1950,  MRN: [id number]  Chief Complaint  Patient presents with   Arthritis    72 y.o. female presents with the above complaint.  Patient presents with left dorsal midfoot pain.  Patient states the right side side is doing well.  She states the left side side is acting up she would like to do another injection as they have not helped for a very long time.  Denies any other acute complaints   Review of Systems: Negative except as noted in the HPI. Denies N/V/F/Ch.  Past Medical History:  Diagnosis Date   Acute kidney injury ([place])    07-20-2015   Arthritis    oa right hand and left foot   Complication of anesthesia    2010 heaRT ABLATION FOR WPW NO ISSUE SINCE   COPD (chronic obstructive pulmonary disease) (HCC)    beginnings of per pt   COVID 01/16/2021   asymptomatic   Hypothyroidism    Multicystic kidney    bilateral-- congenital  (right cyst drainage x2 by radiologist-- 07-20-2015 98m and 07-22-2015   PMB (postmenopausal bleeding)    PONV (postoperative nausea and vomiting)    needs scopolamine patch for nausea dos   Pulmonary nodule    left side  per CT 07-19-2015   Wears contact lenses    WPW (Wolff-Parkinson-White syndrome) last visit cardiologist dr kCaryl Comes2010--  per pt no issues or symptoms since   WITH LEFT POSTEROLATERAL ACCESORY PATHWAY-- S/P  ABLATION  11-23-2008    Current Outpatient Medications:    Ascorbic Acid (VITAMIN C) 1000 MG tablet, Take 1,000 mg by mouth daily., Disp: , Rfl:    atorvastatin (LIPITOR) 20 MG tablet, Take 20 mg by mouth daily., Disp: , Rfl:    Chlorphen-Pseudoephed-APAP (TYLENOL ALLERGY SINUS PO), Take by mouth., Disp: , Rfl:    Chlorpheniramine-PSE-Ibuprofen (ADVIL ALLERGY SINUS) 2-30-200 MG TABS, Take by mouth as needed., Disp: , Rfl:    DICLOFENAC PO, Take 75 mg by mouth daily. Takes 75 mg in pm prn also, Disp: , Rfl:    estradiol (VIVELLE-DOT) 0.05 MG/24HR patch,  Place 1 patch onto the skin 2 (two) times a week. Tues and Saturday., Disp: , Rfl:    fexofenadine (ALLEGRA) 180 MG tablet, Take 180 mg by mouth daily., Disp: , Rfl:    fluticasone (FLONASE) 50 MCG/ACT nasal spray, Place 2 sprays into both nostrils as needed., Disp: , Rfl:    ibuprofen (ADVIL) 200 MG tablet, Take 1 tablet (200 mg total) by mouth every 8 (eight) hours as needed for moderate pain., Disp: 45 tablet, Rfl: 1   levothyroxine (SYNTHROID, LEVOTHROID) 88 MCG tablet, Take 88 mcg by mouth daily before breakfast. ON Sunday'S TAKES TWO TABLETS, Disp: , Rfl:    oxyCODONE (ROXICODONE) 5 MG immediate release tablet, Take 1-2 tablets (5-10 mg total) by mouth every 6 (six) hours as needed for severe pain., Disp: 15 tablet, Rfl: 0   Polyethylene Glycol 3350 (MIRALAX PO), Take by mouth. 1 capful daily, Disp: , Rfl:    Probiotic Product (PROBIOTIC PO), Take by mouth daily. [facility], Disp: , Rfl:    traZODone (DESYREL) 100 MG tablet, Take 200-300 mg by mouth at bedtime. , Disp: , Rfl: 5   vitamin B-12 (CYANOCOBALAMIN) 1000 MCG tablet, Take 1,000 mcg by mouth daily., Disp: , Rfl:    VITAMIN D PO, Take by mouth. 1000 iu daily d 3, Disp: , Rfl:   Social History  Tobacco Use  Smoking Status Every Day   Packs/day: 0.75   Years: 50.00   Total pack years: 37.50   Types: Cigarettes  Smokeless Tobacco Never    No Known Allergies  Objective:  There were no vitals filed for this visit. There is no height or weight on file to calculate BMI. Constitutional Well developed. Well nourished.  Vascular Dorsalis pedis pulses palpable bilaterally. Posterior tibial pulses palpable bilaterally. Capillary refill normal to all digits.  No cyanosis or clubbing noted. Pedal hair growth normal.  Neurologic Normal speech. Oriented to person, place, and time. Epicritic sensation to light touch grossly present bilaterally.  Dermatologic Nails well groomed and normal in appearance. No open wounds. No skin lesions.   Orthopedic:  Pain on palpation to the dorsal midfoot right side negative Tinel's sign.  Good range of motion noted of the metatarsophalangeal joint 2 through 5 without any pain active or passive range of motion left side   Radiographs: 3 views of skeletally mature adult bilateral foot: Left midfoot arthrosis noted to dorsal midtarsal joints.  No other bony abnormalities identified.  No other osteoarthritic changes noted Assessment:   1. Arthritis of right midfoot   2. Capsulitis of right foot     Plan:  Patient was evaluated and treated and all questions answered.  Left side midfoot arthritis with underlying capsulitis -I explained the patient the etiology of arthritis and various treatment options were discussed.  I believe patient will benefit from a steroid injection will decrease acute inflammatory component associated with pain.  Given the amount of pain she is having I believe she will benefit from a steroid injection.  Patient agrees with the plan would like to proceed with a steroid injection -A steroid injection was performed at left midfoot using 1% plain Lidocaine and 10 mg of Kenalog. This was well tolerated. -   No follow-ups on file.

## 2022-10-11 ENCOUNTER — Encounter: Payer: Self-pay | Admitting: Podiatry

## 2022-10-11 ENCOUNTER — Telehealth: Payer: Self-pay | Admitting: Podiatry

## 2022-10-11 NOTE — Telephone Encounter (Signed)
Pt states that she just looked at her AVS in mychart and noticed that the information dictated is incorrect. She states that it says she received an injection but in the the wrong foot and it states that a dosepak was sent in to pharmacy and she was not aware of that... He appt was one week ago.  Please advise.

## 2022-10-22 ENCOUNTER — Telehealth: Payer: Self-pay | Admitting: *Deleted

## 2022-10-22 NOTE — Telephone Encounter (Signed)
error 

## 2022-10-24 ENCOUNTER — Telehealth: Payer: Self-pay | Admitting: Podiatry

## 2022-10-24 MED ORDER — METHYLPREDNISOLONE 4 MG PO TBPK: ORAL_TABLET | ORAL | 0 refills | Status: DC

## 2022-10-24 MED ORDER — MELOXICAM 15 MG PO TABS: 15.0000 mg | ORAL_TABLET | Freq: Every day | ORAL | 0 refills | Status: AC

## 2022-10-24 NOTE — Telephone Encounter (Signed)
Spoke with patient and she did see the information was updated.

## 2022-10-24 NOTE — Telephone Encounter (Signed)
Pt returning your call from 2 days ago.

## 2022-10-24 NOTE — Telephone Encounter (Signed)
Patient is calling to ask for something else along with the injection, pain down metatarsal bone midfoot, no change since visit, wanted to also to know how long will it be before she can get another injection? Please advise.

## 2022-10-31 ENCOUNTER — Telehealth: Payer: Self-pay | Admitting: *Deleted

## 2022-10-31 NOTE — Telephone Encounter (Signed)
Patient has picked up medications ,called and left voice message to call back to see how her foot was doing

## 2022-11-07 DIAGNOSIS — Z780 Asymptomatic menopausal state: Secondary | ICD-10-CM | POA: Diagnosis not present

## 2022-11-07 DIAGNOSIS — Z01411 Encounter for gynecological examination (general) (routine) with abnormal findings: Secondary | ICD-10-CM | POA: Diagnosis not present

## 2022-11-07 DIAGNOSIS — Z79890 Hormone replacement therapy (postmenopausal): Secondary | ICD-10-CM | POA: Diagnosis not present

## 2022-12-23 DIAGNOSIS — M19072 Primary osteoarthritis, left ankle and foot: Secondary | ICD-10-CM | POA: Diagnosis not present

## 2022-12-23 DIAGNOSIS — F17200 Nicotine dependence, unspecified, uncomplicated: Secondary | ICD-10-CM | POA: Diagnosis not present

## 2022-12-24 DIAGNOSIS — E039 Hypothyroidism, unspecified: Secondary | ICD-10-CM | POA: Diagnosis not present

## 2022-12-24 DIAGNOSIS — E785 Hyperlipidemia, unspecified: Secondary | ICD-10-CM | POA: Diagnosis not present

## 2022-12-30 DIAGNOSIS — Z0000 Encounter for general adult medical examination without abnormal findings: Secondary | ICD-10-CM | POA: Diagnosis not present

## 2022-12-30 DIAGNOSIS — Z1339 Encounter for screening examination for other mental health and behavioral disorders: Secondary | ICD-10-CM | POA: Diagnosis not present

## 2022-12-30 DIAGNOSIS — Z1331 Encounter for screening for depression: Secondary | ICD-10-CM | POA: Diagnosis not present

## 2022-12-30 DIAGNOSIS — Z1212 Encounter for screening for malignant neoplasm of rectum: Secondary | ICD-10-CM | POA: Diagnosis not present

## 2022-12-30 DIAGNOSIS — Z720 Tobacco use: Secondary | ICD-10-CM | POA: Diagnosis not present

## 2022-12-30 DIAGNOSIS — M1990 Unspecified osteoarthritis, unspecified site: Secondary | ICD-10-CM | POA: Diagnosis not present

## 2022-12-30 DIAGNOSIS — J449 Chronic obstructive pulmonary disease, unspecified: Secondary | ICD-10-CM | POA: Diagnosis not present

## 2022-12-30 DIAGNOSIS — E039 Hypothyroidism, unspecified: Secondary | ICD-10-CM | POA: Diagnosis not present

## 2022-12-30 DIAGNOSIS — R82998 Other abnormal findings in urine: Secondary | ICD-10-CM | POA: Diagnosis not present

## 2022-12-30 DIAGNOSIS — E785 Hyperlipidemia, unspecified: Secondary | ICD-10-CM | POA: Diagnosis not present

## 2023-02-12 ENCOUNTER — Other Ambulatory Visit: Payer: Self-pay | Admitting: Internal Medicine

## 2023-02-12 DIAGNOSIS — Z1231 Encounter for screening mammogram for malignant neoplasm of breast: Secondary | ICD-10-CM

## 2023-03-10 ENCOUNTER — Ambulatory Visit: Payer: Medicare HMO

## 2023-03-12 ENCOUNTER — Ambulatory Visit
Admission: RE | Admit: 2023-03-12 | Discharge: 2023-03-12 | Disposition: A | Discharge status: Home / Self Care | Payer: Medicare HMO | Source: Ambulatory Visit | Attending: Internal Medicine | Admitting: Internal Medicine

## 2023-03-12 DIAGNOSIS — Z1231 Encounter for screening mammogram for malignant neoplasm of breast: Secondary | ICD-10-CM

## 2023-04-03 DIAGNOSIS — Z85828 Personal history of other malignant neoplasm of skin: Secondary | ICD-10-CM | POA: Diagnosis not present

## 2023-04-03 DIAGNOSIS — D1801 Hemangioma of skin and subcutaneous tissue: Secondary | ICD-10-CM | POA: Diagnosis not present

## 2023-04-03 DIAGNOSIS — D2262 Melanocytic nevi of left upper limb, including shoulder: Secondary | ICD-10-CM | POA: Diagnosis not present

## 2023-04-03 DIAGNOSIS — L814 Other melanin hyperpigmentation: Secondary | ICD-10-CM | POA: Diagnosis not present

## 2023-04-03 DIAGNOSIS — D692 Other nonthrombocytopenic purpura: Secondary | ICD-10-CM | POA: Diagnosis not present

## 2023-04-03 DIAGNOSIS — L821 Other seborrheic keratosis: Secondary | ICD-10-CM | POA: Diagnosis not present

## 2023-04-15 DIAGNOSIS — M19072 Primary osteoarthritis, left ankle and foot: Secondary | ICD-10-CM | POA: Diagnosis not present

## 2023-09-29 DIAGNOSIS — Z0100 Encounter for examination of eyes and vision without abnormal findings: Secondary | ICD-10-CM | POA: Diagnosis not present

## 2023-10-02 DIAGNOSIS — H5213 Myopia, bilateral: Secondary | ICD-10-CM | POA: Diagnosis not present

## 2023-11-06 DIAGNOSIS — Z79890 Hormone replacement therapy (postmenopausal): Secondary | ICD-10-CM | POA: Diagnosis not present

## 2023-11-06 DIAGNOSIS — Z780 Asymptomatic menopausal state: Secondary | ICD-10-CM | POA: Diagnosis not present

## 2023-12-29 DIAGNOSIS — Z1212 Encounter for screening for malignant neoplasm of rectum: Secondary | ICD-10-CM | POA: Diagnosis not present

## 2023-12-29 DIAGNOSIS — E039 Hypothyroidism, unspecified: Secondary | ICD-10-CM | POA: Diagnosis not present

## 2023-12-29 DIAGNOSIS — E785 Hyperlipidemia, unspecified: Secondary | ICD-10-CM | POA: Diagnosis not present

## 2023-12-30 DIAGNOSIS — Z0100 Encounter for examination of eyes and vision without abnormal findings: Secondary | ICD-10-CM | POA: Diagnosis not present

## 2024-01-05 DIAGNOSIS — Z720 Tobacco use: Secondary | ICD-10-CM | POA: Diagnosis not present

## 2024-01-05 DIAGNOSIS — E039 Hypothyroidism, unspecified: Secondary | ICD-10-CM | POA: Diagnosis not present

## 2024-01-05 DIAGNOSIS — Z0000 Encounter for general adult medical examination without abnormal findings: Secondary | ICD-10-CM | POA: Diagnosis not present

## 2024-01-05 DIAGNOSIS — R82998 Other abnormal findings in urine: Secondary | ICD-10-CM | POA: Diagnosis not present

## 2024-01-05 DIAGNOSIS — R32 Unspecified urinary incontinence: Secondary | ICD-10-CM | POA: Diagnosis not present

## 2024-01-05 DIAGNOSIS — R634 Abnormal weight loss: Secondary | ICD-10-CM | POA: Diagnosis not present

## 2024-01-05 DIAGNOSIS — Z1339 Encounter for screening examination for other mental health and behavioral disorders: Secondary | ICD-10-CM | POA: Diagnosis not present

## 2024-01-05 DIAGNOSIS — J449 Chronic obstructive pulmonary disease, unspecified: Secondary | ICD-10-CM | POA: Diagnosis not present

## 2024-01-05 DIAGNOSIS — E785 Hyperlipidemia, unspecified: Secondary | ICD-10-CM | POA: Diagnosis not present

## 2024-01-05 DIAGNOSIS — E042 Nontoxic multinodular goiter: Secondary | ICD-10-CM | POA: Diagnosis not present

## 2024-01-05 DIAGNOSIS — Z23 Encounter for immunization: Secondary | ICD-10-CM | POA: Diagnosis not present

## 2024-01-05 DIAGNOSIS — Z1331 Encounter for screening for depression: Secondary | ICD-10-CM | POA: Diagnosis not present

## 2024-02-12 DIAGNOSIS — Z1211 Encounter for screening for malignant neoplasm of colon: Secondary | ICD-10-CM | POA: Diagnosis not present

## 2024-04-12 ENCOUNTER — Other Ambulatory Visit: Payer: Self-pay | Admitting: Internal Medicine

## 2024-04-12 DIAGNOSIS — Z1231 Encounter for screening mammogram for malignant neoplasm of breast: Secondary | ICD-10-CM

## 2024-05-06 ENCOUNTER — Ambulatory Visit
Admission: RE | Admit: 2024-05-06 | Discharge: 2024-05-06 | Disposition: A | Discharge status: Home / Self Care | Source: Ambulatory Visit | Attending: Internal Medicine | Admitting: Internal Medicine

## 2024-05-06 DIAGNOSIS — Z1231 Encounter for screening mammogram for malignant neoplasm of breast: Secondary | ICD-10-CM | POA: Diagnosis not present

## 2024-05-10 ENCOUNTER — Encounter: Payer: Self-pay | Admitting: Gastroenterology

## 2024-06-01 ENCOUNTER — Ambulatory Visit

## 2024-06-01 VITALS — Ht 68.0 in | Wt 125.0 lb

## 2024-06-01 DIAGNOSIS — Z1211 Encounter for screening for malignant neoplasm of colon: Secondary | ICD-10-CM

## 2024-06-01 MED ORDER — NA SULFATE-K SULFATE-MG SULF 17.5-3.13-1.6 GM/177ML PO SOLN: 1.0000 | Freq: Once | ORAL | 0 refills | Status: AC

## 2024-06-01 NOTE — Progress Notes (Signed)
 No egg or soy allergy known to patient  No issues known to pt with past sedation with any surgeries or procedures Patient denies ever being told they had issues or difficulty with intubation  No FH of Malignant Hyperthermia Pt is not on diet pills Pt is not on  home 02  Pt is not on blood thinners  Pt denies issues with constipation takes Miralax No A fib or A flutter Have any cardiac testing pending--no Pt can ambulate independently Pt denies use of chewing tobacco Discussed diabetic I weight loss medication holds Discussed NSAID holds Checked BMI Pt instructed to use [employer] or GoodRx for a price reduction on prep  Patient's chart reviewed by Norleen Schillings CNRA prior to previsit and patient appropriate for the [facility].  Pre visit completed and red dot placed by patient's name on their procedure day (on provider's schedule).

## 2024-06-03 DIAGNOSIS — Z85828 Personal history of other malignant neoplasm of skin: Secondary | ICD-10-CM | POA: Diagnosis not present

## 2024-06-03 DIAGNOSIS — L814 Other melanin hyperpigmentation: Secondary | ICD-10-CM | POA: Diagnosis not present

## 2024-06-03 DIAGNOSIS — D2272 Melanocytic nevi of left lower limb, including hip: Secondary | ICD-10-CM | POA: Diagnosis not present

## 2024-06-03 DIAGNOSIS — L570 Actinic keratosis: Secondary | ICD-10-CM | POA: Diagnosis not present

## 2024-06-03 DIAGNOSIS — H61002 Unspecified perichondritis of left external ear: Secondary | ICD-10-CM | POA: Diagnosis not present

## 2024-06-03 DIAGNOSIS — D2262 Melanocytic nevi of left upper limb, including shoulder: Secondary | ICD-10-CM | POA: Diagnosis not present

## 2024-06-03 DIAGNOSIS — D1801 Hemangioma of skin and subcutaneous tissue: Secondary | ICD-10-CM | POA: Diagnosis not present

## 2024-06-14 ENCOUNTER — Encounter: Payer: Self-pay | Admitting: Gastroenterology

## 2024-06-22 ENCOUNTER — Encounter: Payer: Self-pay | Admitting: Gastroenterology

## 2024-06-22 ENCOUNTER — Ambulatory Visit: Admitting: Gastroenterology

## 2024-06-22 VITALS — BP 140/76 | HR 91 | Temp 97.9°F | Resp 21 | Ht 68.0 in | Wt 125.0 lb

## 2024-06-22 DIAGNOSIS — Z1211 Encounter for screening for malignant neoplasm of colon: Secondary | ICD-10-CM | POA: Diagnosis not present

## 2024-06-22 DIAGNOSIS — R195 Other fecal abnormalities: Secondary | ICD-10-CM

## 2024-06-22 DIAGNOSIS — K644 Residual hemorrhoidal skin tags: Secondary | ICD-10-CM | POA: Diagnosis not present

## 2024-06-22 DIAGNOSIS — K5730 Diverticulosis of large intestine without perforation or abscess without bleeding: Secondary | ICD-10-CM | POA: Diagnosis not present

## 2024-06-22 DIAGNOSIS — D125 Benign neoplasm of sigmoid colon: Secondary | ICD-10-CM | POA: Diagnosis not present

## 2024-06-22 DIAGNOSIS — K635 Polyp of colon: Secondary | ICD-10-CM | POA: Diagnosis not present

## 2024-06-22 DIAGNOSIS — K648 Other hemorrhoids: Secondary | ICD-10-CM | POA: Diagnosis not present

## 2024-06-22 DIAGNOSIS — J449 Chronic obstructive pulmonary disease, unspecified: Secondary | ICD-10-CM | POA: Diagnosis not present

## 2024-06-22 DIAGNOSIS — Q439 Congenital malformation of intestine, unspecified: Secondary | ICD-10-CM | POA: Diagnosis not present

## 2024-06-22 DIAGNOSIS — E039 Hypothyroidism, unspecified: Secondary | ICD-10-CM | POA: Diagnosis not present

## 2024-06-22 MED ORDER — SODIUM CHLORIDE 0.9 % IV SOLN: 500.0000 mL | Freq: Once | INTRAVENOUS | Status: DC

## 2024-06-22 NOTE — Progress Notes (Signed)
 Vss nad trans to pacu

## 2024-06-22 NOTE — Progress Notes (Signed)
 Pt's states no medical or surgical changes since previsit or office visit.

## 2024-06-22 NOTE — Progress Notes (Signed)
 Called to room to assist during endoscopic procedure.  Patient ID and intended procedure confirmed with present staff. Received instructions for my participation in the procedure from the performing physician.

## 2024-06-22 NOTE — Op Note (Signed)
 [facility] Patient Name: Carol Theys Procedure Date: 06/22/2024 9:44 AM MRN: [id number] Endoscopist: Elspeth P. Leigh , MD, [id number] Age: 73 Referring MD:  Date of Birth: 07/13/1951 Gender: Female Account #: [id number] Procedure:                Colonoscopy Indications:              Positive Cologuard test - last exam 2008 Medicines:                Monitored Anesthesia Care Procedure:                Pre-Anesthesia Assessment:                           - Prior to the procedure, a History and Physical                            was performed, and patient medications and                            allergies were reviewed. The patient's tolerance of                            previous anesthesia was also reviewed. The risks                            and benefits of the procedure and the sedation                            options and risks were discussed with the patient.                            All questions were answered, and informed consent                            was obtained. Prior Anticoagulants: The patient has                            taken no anticoagulant or antiplatelet agents. ASA                            Grade Assessment: III - A patient with severe                            systemic disease. After reviewing the risks and                            benefits, the patient was deemed in satisfactory                            condition to undergo the procedure.                           After obtaining informed consent, the colonoscope  was passed under direct vision. Throughout the                            procedure, the patient's blood pressure, pulse, and                            oxygen saturations were monitored continuously. The                            PCF-HQ190L Colonoscope [id number] was introduced                            through the anus and advanced to the the cecum,                            identified  by appendiceal orifice and ileocecal                            valve. The colonoscopy was performed without                            difficulty. The patient tolerated the procedure                            well. The quality of the bowel preparation was                            adequate. The ileocecal valve, appendiceal orifice,                            and rectum were photographed. Scope In: 9:53:35 AM Scope Out: 10:20:53 AM Scope Withdrawal Time: 0 hours 23 minutes 42 seconds  Total Procedure Duration: 0 hours 27 minutes 18 seconds  Findings:                 Skin tags were found on perianal exam.                           Many small-mouthed diverticula were found in the                            entire colon.                           Two sessile polyps were found in the sigmoid colon.                            The polyps were 4 to 5 mm in size. These polyps                            were removed with a cold snare. Resection and                            retrieval were complete.  Internal hemorrhoids were found during retroflexion.                           The colon was tortous. The exam was otherwise                            without abnormality. Complications:            No immediate complications. Estimated blood loss:                            Minimal. Estimated Blood Loss:     Estimated blood loss was minimal. Impression:               - Perianal skin tags found on perianal exam.                           - Diverticulosis in the entire examined colon.                           - Two 4 to 5 mm polyps in the sigmoid colon,                            removed with a cold snare. Resected and retrieved.                           - Internal hemorrhoids.                           - Tortous colon                           - The examination was otherwise normal. Recommendation:           - Patient has a contact number available for                             emergencies. The signs and symptoms of potential                            delayed complications were discussed with the                            patient. Return to normal activities tomorrow.                            Written discharge instructions were provided to the                            patient.                           - Resume previous diet.                           - Continue present medications.                           -  Await pathology results. Elspeth P. Timithy Arons, MD 06/22/2024 10:26:17 AM This report has been signed electronically.

## 2024-06-22 NOTE — Patient Instructions (Addendum)

## 2024-06-22 NOTE — Progress Notes (Signed)
 [facility] History and Physical   Primary Care Physician:  Shepard Ade, MD   Reason for Procedure:   Positive cologuard  Plan:    colonoscopy     HPI: Jennifer George is a 73 y.o. female  here for colonoscopy - she endorses last exam in 2008. Positive cologuard recently per primary care. She has hemorrhoids and has occasional bleeding attributed to those. Uses miralax PRN For constipation.   No family history of colon cancer known. Otherwise feels well without any cardiopulmonary symptoms.   I have discussed risks / benefits of anesthesia and endoscopic procedure with Avelina KATHEE Irvine and they wish to proceed with the exams as outlined today.    Past Medical History:  Diagnosis Date   Acute kidney injury    07-20-2015   Arthritis    oa right hand and left foot   Complication of anesthesia    2010 heaRT ABLATION FOR WPW NO ISSUE SINCE   COPD (chronic obstructive pulmonary disease) (HCC)    beginnings of per pt   COVID 01/16/2021   asymptomatic   Hypothyroidism    Multicystic kidney    bilateral-- congenital  (right cyst drainage x2 by radiologist-- 07-20-2015 and 07-22-2015   PMB (postmenopausal bleeding)    PONV (postoperative nausea and vomiting)    needs scopolamine  patch for nausea dos   Pulmonary nodule    left side  per CT 07-19-2015   Wears contact lenses    WPW (Wolff-Parkinson-White syndrome) last visit cardiologist dr fernande 2010--  per pt no issues or symptoms since   WITH LEFT POSTEROLATERAL ACCESORY PATHWAY-- S/P  ABLATION  11-23-2008    Past Surgical History:  Procedure Laterality Date   CARDIAC ELECTROPHYSIOLOGY STUDY AND ABLATION  11-23-2008   DR FERNANDE   Ablation left posterolateral accessory pathway   CYSTOSCOPY N/A 03/22/2021   Procedure: CYSTOSCOPY;  Surgeon: Danielle Rom, MD;  Location: [facility];  Service: Gynecology;  Laterality: N/A;   CYSTOSCOPY W/ RETROGRADES Right 08/28/2015   Procedure:  CYSTOSCOPY WITH RETROGRADE PYELOGRAM;  Surgeon: Garnette Shack, MD;  Location: [facility];  Service: Urology;  Laterality: Right;   CYSTOSCOPY W/ URETERAL STENT PLACEMENT Right 07/28/2015   Procedure: CYSTOSCOPY WITH RETROGRADE PYELOGRAM/URETERAL STENT PLACEMENT;  Surgeon: Garnette Shack, MD;  Location: [facility];  Service: Urology;  Laterality: Right;   CYSTOSCOPY W/ URETERAL STENT REMOVAL Right 08/28/2015   Procedure: CYSTOSCOPY WITH STENT REMOVAL;  Surgeon: Garnette Shack, MD;  Location: [facility];  Service: Urology;  Laterality: Right;   DILATION AND CURETTAGE OF UTERUS  1981 & 1982   EXCISION MORTON'S NEUROMA  2000   LAPAROSCOPIC VAGINAL HYSTERECTOMY WITH SALPINGECTOMY Bilateral 03/22/2021   Procedure: LAPAROSCOPIC ASSISTED VAGINAL HYSTERECTOMY WITH SALPINGECTOMY;  Surgeon: Danielle Rom, MD;  Location: [facility];  Service: Gynecology;  Laterality: Bilateral;   POSTERIOR AND ENTEROCELE REPAIR/  CARDINAL UTEROSACRAL COLPOSUSPENSION  06-29-2009   TRANSTHORACIC ECHOCARDIOGRAM  10-05-2008   normal LVF, ef 55-60%/  mild AV sclerosis and calcification without stenosis/  trivial MR and TR    Prior to Admission medications   Medication Sig Start Date End Date Taking? Authorizing Provider  Ascorbic Acid (VITAMIN C) 1000 MG tablet Take 1,000 mg by mouth daily.   Yes [provider]  atorvastatin  (LIPITOR) 20 MG tablet Take 20 mg by mouth daily.   Yes [provider]  DICLOFENAC PO Take 75 mg by mouth daily. Takes 75 mg in pm prn also  Yes [provider]  diclofenac Sodium (VOLTAREN) 1 % GEL Apply 2 g topically. As needed 12/23/22  Yes [provider]  estradiol  (VIVELLE -DOT) 0.05 MG/24HR patch Place 1 patch onto the skin 2 (two) times a week. Tues and Saturday.   Yes [provider]  fexofenadine (ALLEGRA) 180 MG tablet Take 180 mg by mouth daily.   Yes [provider]   ibuprofen  (ADVIL ) 200 MG tablet Take 1 tablet (200 mg total) by mouth every 8 (eight) hours as needed for moderate pain. 03/22/21  Yes Bovard-Stuckert, Jody, MD  levothyroxine  (SYNTHROID , LEVOTHROID) 88 MCG tablet Take 88 mcg by mouth daily before breakfast. ON Sunday'S TAKES TWO TABLETS   Yes [provider]  methocarbamol  (ROBAXIN ) 500 MG tablet Take 500 mg by mouth. As needed 12/30/22  Yes [provider]  Polyethylene Glycol 3350 (MIRALAX PO) Take by mouth. 1 capful daily   Yes [provider]  Probiotic Product (PROBIOTIC PO) Take by mouth daily. [facility]   Yes [provider]  traZODone  (DESYREL ) 100 MG tablet Take 200-300 mg by mouth at bedtime.  07/06/15  Yes [provider]  vitamin B-12 (CYANOCOBALAMIN) 1000 MCG tablet Take 1,000 mcg by mouth daily.   Yes [provider]  VITAMIN D PO Take by mouth. 1000 iu daily d 3   Yes [provider]  benzonatate (TESSALON) 100 MG capsule Take 100 mg by mouth 3 (three) times daily as needed.    [provider]  Chlorphen-Pseudoephed-APAP (TYLENOL  ALLERGY SINUS PO) Take by mouth.    [provider]  Chlorpheniramine-PSE-Ibuprofen  (ADVIL  ALLERGY SINUS) 2-30-200 MG TABS Take by mouth as needed.    [provider]  fluticasone  (FLONASE ) 50 MCG/ACT nasal spray Place 2 sprays into both nostrils as needed.    [provider]  Lifitegrast CARON) 5 % SOLN Place 1 drop into both eyes 2 (two) times daily.    [provider]  meloxicam  (MOBIC ) 15 MG tablet Take 1 tablet (15 mg total) by mouth daily. Patient not taking: No sig reported 10/24/22   Tobie Franky SQUIBB, DPM  methylPREDNISolone  (MEDROL  DOSEPAK) 4 MG TBPK tablet Take as directed Patient not taking: No sig reported 10/24/22   Tobie Franky SQUIBB, DPM  oxyCODONE  (ROXICODONE ) 5 MG immediate release tablet Take 1-2 tablets (5-10 mg total) by mouth every 6 (six) hours as needed for severe pain. 03/22/21    Bovard-Stuckert, Jody, MD    Current Outpatient Medications  Medication Sig Dispense Refill   Ascorbic Acid (VITAMIN C) 1000 MG tablet Take 1,000 mg by mouth daily.     atorvastatin  (LIPITOR) 20 MG tablet Take 20 mg by mouth daily.     DICLOFENAC PO Take 75 mg by mouth daily. Takes 75 mg in pm prn also     diclofenac Sodium (VOLTAREN) 1 % GEL Apply 2 g topically. As needed     estradiol  (VIVELLE -DOT) 0.05 MG/24HR patch Place 1 patch onto the skin 2 (two) times a week. Tues and Saturday.     fexofenadine (ALLEGRA) 180 MG tablet Take 180 mg by mouth daily.     ibuprofen  (ADVIL ) 200 MG tablet Take 1 tablet (200 mg total) by mouth every 8 (eight) hours as needed for moderate pain. 45 tablet 1   levothyroxine  (SYNTHROID , LEVOTHROID) 88 MCG tablet Take 88 mcg by mouth daily before breakfast. ON Sunday'S TAKES TWO TABLETS     methocarbamol  (ROBAXIN ) 500 MG tablet Take 500 mg by mouth. As needed  Polyethylene Glycol 3350 (MIRALAX PO) Take by mouth. 1 capful daily     Probiotic Product (PROBIOTIC PO) Take by mouth daily. [facility]     traZODone  (DESYREL ) 100 MG tablet Take 200-300 mg by mouth at bedtime.   5   vitamin B-12 (CYANOCOBALAMIN) 1000 MCG tablet Take 1,000 mcg by mouth daily.     VITAMIN D PO Take by mouth. 1000 iu daily d 3     benzonatate (TESSALON) 100 MG capsule Take 100 mg by mouth 3 (three) times daily as needed.     Chlorphen-Pseudoephed-APAP (TYLENOL  ALLERGY SINUS PO) Take by mouth.     Chlorpheniramine-PSE-Ibuprofen  (ADVIL  ALLERGY SINUS) 2-30-200 MG TABS Take by mouth as needed.     fluticasone  (FLONASE ) 50 MCG/ACT nasal spray Place 2 sprays into both nostrils as needed.     Lifitegrast (XIIDRA) 5 % SOLN Place 1 drop into both eyes 2 (two) times daily.     meloxicam  (MOBIC ) 15 MG tablet Take 1 tablet (15 mg total) by mouth daily. (Patient not taking: No sig reported) 30 tablet 0   methylPREDNISolone  (MEDROL  DOSEPAK) 4 MG TBPK tablet Take as directed (Patient not taking: No sig  reported) 21 each 0   oxyCODONE  (ROXICODONE ) 5 MG immediate release tablet Take 1-2 tablets (5-10 mg total) by mouth every 6 (six) hours as needed for severe pain. 15 tablet 0   Current Facility-Administered Medications  Medication Dose Route Frequency Provider Last Rate Last Admin   0.9 %  sodium chloride  infusion  500 mL Intravenous Once [place] Grieshop, Elspeth SQUIBB, MD        Allergies as of 06/22/2024   (No Known Allergies)    History reviewed. No pertinent family history.  Social History   Socioeconomic History   Marital status: Married    Spouse name: Not on file   Number of children: 3   Years of education: Not on file   Highest education level: Not on file  Occupational History   Occupation: network engineer  Tobacco Use   Smoking status: Every Day    Current packs/day: 0.75    Average packs/day: 0.8 packs/day for 50.0 years (37.5 ttl pk-yrs)    Types: Cigarettes   Smokeless tobacco: Never  Vaping Use   Vaping status: Never Used  Substance and Sexual Activity   Alcohol  use: Yes    Comment: RARE   Drug use: No   Sexual activity: Not on file  Other Topics Concern   Not on file  Social History Narrative   Not on file   Social Drivers of Health   Financial Resource Strain: Not on file  Food Insecurity: Not on file  Transportation Needs: Not on file  Physical Activity: Not on file  Stress: Not on file  Social Connections: Unknown (12/30/2021)   Received from [facility]   Social Network    Social Network: Not on file  Intimate Partner Violence: Unknown (11/21/2021)   Received from [facility]   HITS    Physically Hurt: Not on file    Insult or Talk Down To: Not on file    Threaten Physical Harm: Not on file    Scream or Curse: Not on file    Review of Systems: All other review of systems negative except as mentioned in the HPI.  Physical Exam: Vital signs BP (!) 166/97   Pulse 77   Temp 97.9 F (36.6 C) (Temporal)   Ht 5' 8 (1.727 m)   Wt 125 lb  (56.7 kg)  SpO2 98%   BMI 19.01 kg/m   General:   Alert,  Well-developed, pleasant and cooperative in NAD Lungs:  Clear throughout to auscultation.   Heart:  Regular rate and rhythm Abdomen:  Soft, nontender and nondistended.   Neuro/Psych:  Alert and cooperative. Normal mood and affect. A and O x 3  Marcey Naval, MD [facility]

## 2024-06-23 ENCOUNTER — Telehealth: Payer: Self-pay

## 2024-06-23 NOTE — Telephone Encounter (Signed)
 Left message on answering machine.

## 2024-06-24 LAB — SURGICAL PATHOLOGY

## 2024-06-25 ENCOUNTER — Ambulatory Visit: Payer: Self-pay | Admitting: Gastroenterology
